# Patient Record
Sex: Female | Born: 1935 | Race: Black or African American | Hispanic: No | Smoking: Never smoker
Health system: Southern US, Community
[De-identification: ages and names within clinical notes are randomized; demographics above are authoritative.]

## PROBLEM LIST (undated history)

## (undated) DIAGNOSIS — IMO0001 Reserved for inherently not codable concepts without codable children: Secondary | ICD-10-CM

## (undated) DIAGNOSIS — I10 Essential (primary) hypertension: Secondary | ICD-10-CM

## (undated) DIAGNOSIS — M1990 Unspecified osteoarthritis, unspecified site: Secondary | ICD-10-CM

## (undated) DIAGNOSIS — K219 Gastro-esophageal reflux disease without esophagitis: Secondary | ICD-10-CM

## (undated) DIAGNOSIS — C8590 Non-Hodgkin lymphoma, unspecified, unspecified site: Secondary | ICD-10-CM

## (undated) DIAGNOSIS — F0390 Unspecified dementia without behavioral disturbance: Secondary | ICD-10-CM

## (undated) DIAGNOSIS — E7800 Pure hypercholesterolemia, unspecified: Secondary | ICD-10-CM

## (undated) DIAGNOSIS — C801 Malignant (primary) neoplasm, unspecified: Secondary | ICD-10-CM

## (undated) HISTORY — PX: CATARACT EXTRACTION: SUR2

## (undated) HISTORY — DX: Essential (primary) hypertension: I10

## (undated) HISTORY — PX: KNEE ARTHROSCOPY: SUR90

## (undated) HISTORY — DX: Pure hypercholesterolemia, unspecified: E78.00

## (undated) HISTORY — DX: Gastro-esophageal reflux disease without esophagitis: K21.9

## (undated) HISTORY — DX: Unspecified osteoarthritis, unspecified site: M19.90

## (undated) HISTORY — DX: Reserved for inherently not codable concepts without codable children: IMO0001

---

## 2005-12-16 ENCOUNTER — Ambulatory Visit: Payer: Self-pay | Admitting: Internal Medicine

## 2006-08-04 ENCOUNTER — Ambulatory Visit: Payer: Self-pay | Admitting: Gerontology

## 2006-08-28 ENCOUNTER — Ambulatory Visit: Payer: Self-pay

## 2006-12-18 ENCOUNTER — Ambulatory Visit: Payer: Self-pay | Admitting: Internal Medicine

## 2006-12-28 ENCOUNTER — Inpatient Hospital Stay: Payer: Self-pay | Admitting: Unknown Physician Specialty

## 2007-01-01 ENCOUNTER — Encounter: Payer: Self-pay | Admitting: Internal Medicine

## 2007-01-09 ENCOUNTER — Encounter: Payer: Self-pay | Admitting: Internal Medicine

## 2007-02-09 ENCOUNTER — Encounter: Payer: Self-pay | Admitting: Internal Medicine

## 2007-03-11 ENCOUNTER — Ambulatory Visit: Payer: Self-pay | Admitting: Unknown Physician Specialty

## 2007-03-11 ENCOUNTER — Encounter: Payer: Self-pay | Admitting: Internal Medicine

## 2007-03-22 ENCOUNTER — Inpatient Hospital Stay: Payer: Self-pay | Admitting: Internal Medicine

## 2007-03-22 ENCOUNTER — Other Ambulatory Visit: Payer: Self-pay

## 2007-04-06 ENCOUNTER — Ambulatory Visit: Payer: Self-pay | Admitting: Unknown Physician Specialty

## 2008-01-07 ENCOUNTER — Ambulatory Visit: Payer: Self-pay | Admitting: Internal Medicine

## 2008-07-27 ENCOUNTER — Ambulatory Visit: Payer: Self-pay | Admitting: Unknown Physician Specialty

## 2008-07-27 ENCOUNTER — Ambulatory Visit: Payer: Self-pay | Admitting: Cardiology

## 2008-07-27 ENCOUNTER — Other Ambulatory Visit: Payer: Self-pay

## 2008-08-03 ENCOUNTER — Inpatient Hospital Stay: Payer: Self-pay | Admitting: Unknown Physician Specialty

## 2008-08-09 ENCOUNTER — Encounter: Payer: Self-pay | Admitting: Internal Medicine

## 2008-08-10 ENCOUNTER — Encounter: Payer: Self-pay | Admitting: Internal Medicine

## 2009-02-01 ENCOUNTER — Ambulatory Visit: Payer: Self-pay | Admitting: Internal Medicine

## 2010-02-20 ENCOUNTER — Ambulatory Visit: Payer: Self-pay | Admitting: Internal Medicine

## 2010-06-11 ENCOUNTER — Ambulatory Visit: Payer: Self-pay | Admitting: Ophthalmology

## 2010-06-24 ENCOUNTER — Ambulatory Visit: Payer: Self-pay | Admitting: Ophthalmology

## 2011-06-04 ENCOUNTER — Ambulatory Visit: Payer: Self-pay | Admitting: Internal Medicine

## 2011-09-03 ENCOUNTER — Ambulatory Visit: Payer: Self-pay

## 2012-08-26 ENCOUNTER — Emergency Department: Payer: Self-pay | Admitting: Unknown Physician Specialty

## 2012-08-26 LAB — APTT: Activated PTT: 25.2 secs (ref 23.6–35.9)

## 2012-08-26 LAB — COMPREHENSIVE METABOLIC PANEL
Albumin: 3.6 g/dL (ref 3.4–5.0)
Anion Gap: 11 (ref 7–16)
BUN: 14 mg/dL (ref 7–18)
Bilirubin,Total: 0.8 mg/dL (ref 0.2–1.0)
Chloride: 91 mmol/L — ABNORMAL LOW (ref 98–107)
Creatinine: 0.59 mg/dL — ABNORMAL LOW (ref 0.60–1.30)
EGFR (African American): >60
Glucose: 149 mg/dL — ABNORMAL HIGH (ref 65–99)
Potassium: 3.9 mmol/L (ref 3.5–5.1)
SGPT (ALT): 31 U/L (ref 12–78)
Sodium: 131 mmol/L — ABNORMAL LOW (ref 136–145)
Total Protein: 8.6 g/dL — ABNORMAL HIGH (ref 6.4–8.2)

## 2012-08-26 LAB — CBC
MCHC: 33.6 g/dL (ref 32.0–36.0)
Platelet: 208 10*3/uL (ref 150–440)
RDW: 13.9 % (ref 11.5–14.5)

## 2012-08-26 LAB — MAGNESIUM: Magnesium: 1.2 mg/dL — ABNORMAL LOW

## 2012-08-28 ENCOUNTER — Observation Stay: Payer: Self-pay | Admitting: Internal Medicine

## 2012-08-28 LAB — COMPREHENSIVE METABOLIC PANEL
Bilirubin,Total: 0.5 mg/dL (ref 0.2–1.0)
Calcium, Total: 8.3 mg/dL — ABNORMAL LOW (ref 8.5–10.1)
Chloride: 97 mmol/L — ABNORMAL LOW (ref 98–107)
Co2: 29 mmol/L (ref 21–32)
Creatinine: 2.57 mg/dL — ABNORMAL HIGH (ref 0.60–1.30)
EGFR (African American): 19 — ABNORMAL LOW
EGFR (Non-African Amer.): 17 — ABNORMAL LOW
SGOT(AST): 23 U/L (ref 15–37)
SGPT (ALT): 23 U/L (ref 12–78)

## 2012-08-28 LAB — TROPONIN I
Troponin-I: 0.03 ng/mL
Troponin-I: 0.03 ng/mL
Troponin-I: <0.02 ng/mL

## 2012-08-28 LAB — URINALYSIS, COMPLETE
Bilirubin,UR: NEGATIVE
Blood: NEGATIVE
Glucose,UR: NEGATIVE mg/dL
Granular Cast: 232
Hyaline Cast: 104
Leukocyte Esterase: NEGATIVE
Nitrite: NEGATIVE
Ph: 5
Protein: >=500
RBC,UR: NONE SEEN /HPF
Specific Gravity: 1.016
Squamous Epithelial: 3
WBC UR: NONE SEEN /HPF

## 2012-08-28 LAB — CK TOTAL AND CKMB (NOT AT ARMC)
CK, Total: 119 U/L (ref 21–215)
CK-MB: 2.5 ng/mL (ref 0.5–3.6)

## 2012-08-28 LAB — CBC
HGB: 11.7 g/dL — ABNORMAL LOW (ref 12.0–16.0)
MCHC: 33.6 g/dL (ref 32.0–36.0)
Platelet: 168 10*3/uL (ref 150–440)
RDW: 14.2 % (ref 11.5–14.5)

## 2012-08-29 LAB — BASIC METABOLIC PANEL
Anion Gap: 6 — ABNORMAL LOW (ref 7–16)
BUN: 28 mg/dL — ABNORMAL HIGH (ref 7–18)
Chloride: 112 mmol/L — ABNORMAL HIGH (ref 98–107)
Co2: 27 mmol/L (ref 21–32)
Creatinine: 0.93 mg/dL (ref 0.60–1.30)
EGFR (Non-African Amer.): 57 — ABNORMAL LOW
Glucose: 98 mg/dL (ref 65–99)
Potassium: 3.9 mmol/L (ref 3.5–5.1)

## 2012-08-29 LAB — CBC WITH DIFFERENTIAL/PLATELET
Basophil #: 0.1 10*3/uL (ref 0.0–0.1)
Basophil %: 0.9 %
Eosinophil #: 0.1 10*3/uL (ref 0.0–0.7)
HCT: 30.6 % — ABNORMAL LOW (ref 35.0–47.0)
Lymphocyte #: 1.8 10*3/uL (ref 1.0–3.6)
Lymphocyte %: 28.2 %
MCH: 29.9 pg (ref 26.0–34.0)
MCHC: 33.3 g/dL (ref 32.0–36.0)
Monocyte #: 1 x10 3/mm — ABNORMAL HIGH (ref 0.2–0.9)
Monocyte %: 16.2 %
Neutrophil #: 3.4 10*3/uL (ref 1.4–6.5)
Neutrophil %: 53.7 %
Platelet: 140 10*3/uL — ABNORMAL LOW (ref 150–440)
RDW: 14.5 % (ref 11.5–14.5)
WBC: 6.3 10*3/uL (ref 3.6–11.0)

## 2012-11-08 ENCOUNTER — Ambulatory Visit: Payer: Self-pay | Admitting: Internal Medicine

## 2015-02-27 NOTE — H&P (Signed)
PATIENT NAME:  Barbara Chavez, Barbara Chavez MR#:  [id number] DATE OF BIRTH:  January 08, 1927  DATE OF ADMISSION:  08/28/2012  PRIMARY CARE PHYSICIAN: Frazier Richards, MD, [facility]    ER PHYSICIAN: Lurline Hare, MD   ADMITTING PHYSICIAN: Dr. Pearletha Furl   PRESENTING COMPLAINT: Loss of consciousness.   HISTORY: The patient is an 81 year old lady who was seen in the Emergency Room yesterday for gastroenteritis and was discharged home. However, on getting home she took her blood pressure medications which included clonidine, labetalol, and some fluid pill which she does not recall the name. Has been having generalized dizziness and weakness and this evening passed out at home when after getting up and for this was brought to the Emergency Room by EMTs. There was no episode of chest pain. No PND, orthopnea, or pedal edema. The patient denies any palpitations. Admits to increasing lightheadedness especially with change in posture. For this she was seen in the Emergency Room. Work-up here included a CBC which showed low blood count from 13.6 yesterday, now down to 11.7. The patient denies any episode of bleeding from the vomitus. Also, chemistry shows creatinine has gone up to 2.5 from baseline of 0.5 from yesterday with BUN of 41 from 14. For this she was referred to hospitalist for further evaluation. The patient denies any recent long distance travel or sick contact. No recent change in medication. Admits to being compliant with her medication.   REVIEW OF SYSTEMS: CONSTITUTIONAL: Positive for fatigue, weakness. No weight loss or weight gain. EYES: Positive for lightheadedness and blurred vision on change in posture but no redness or discharge. ENT: No tinnitus, epistaxis, or difficulty swallowing. RESPIRATORY: Denies cough or shortness of breath. CARDIOVASCULAR: No chest pain, palpitations, arrhythmia, or exertional dyspnea but had syncopal episode today. GI: Positive for nausea and vomiting in the evening. Has not had  any diarrhea. No abdominal pain. GU: No dysuria, frequency, or incontinence. HEMATOLOGIC: No anemia, easy bruising or bleeding. ENDOCRINE: No polyuria or polydipsia. SKIN: No rash, change in hair or skin texture. MUSCULOSKELETAL: No joint pain, redness, or limited activity. NEUROLOGIC: No numbness, tremors, or memory loss. PSYCH: No anxiety or depression.   PAST MEDICAL HISTORY:  1. Hyperlipidemia. 2. Migraine.  3. Anxiety.  4. Osteoarthritis.  5. Hypertension.  6. Asthma.  7. Glaucoma.   PAST SURGICAL HISTORY: Bilateral total knee replacement.   SOCIAL HISTORY: Lives at home with her family. No alcohol, tobacco, or recreational drug use.   FAMILY HISTORY: Reviewed and noncontributory.   ALLERGIES: No known drug allergies.   MEDICATIONS:  1. Tramadol 50 mg 2 tablets q.8 hours. 2. Clonidine 0.2 mg twice daily.  3. Citalopram 20 mg daily.  4. Atorvastatin 40 mg daily.  5. Buspirone 15 mg twice a day. 6. Labetalol 300 mg twice daily.  7. The patient also states she takes a fluid, which she cannot recall the name.   PHYSICAL EXAMINATION:   VITAL SIGNS: Temperature 98.2, pulse 73, respiratory rate 18, blood pressure 103/51, oxygen sat 97 on room air. The patient is too weak to do any orthostatics with changing postures.   GENERAL: Elderly lady lying on the gurney awake, alert, oriented to time, place, and person, appears very weak.   HEENT: Atraumatic, normocephalic. Pupils equal, reactive to light and accommodation. Extraocular movement intact. Mucous membranes pink and dry.   NECK: Supple. No JV distention.   CHEST: Good air entry. Clear to auscultation.   HEART: Regular rate and rhythm. No murmurs.   ABDOMEN: Full, moves  with respiration, nontender. Bowel sounds hypoactive. No organomegaly.   EXTREMITIES: No edema, clubbing, deformity.   NEUROLOGICAL: No focal deficits.   PSYCH: Affect appropriate to situation.   LABORATORY, DIAGNOSTIC, AND RADIOLOGICAL DATA: EKG  showed normal sinus rhythm, rate of 73.   CT head showed no acute intracranial abnormality.   CBC white count 9, hemoglobin 11.7 down from 13.6 from yesterday, platelets 168. No differential. Chemistries sodium 138, potassium 3, down from 3.9, creatinine 2.5 up from 0.5 from yesterday. BUN is 41 from 14, glucose 108, calcium 8.3. Normal LFTs. Troponin negative. Urinalysis is negative.   IMPRESSION:  1. Syncopal episode most likely from orthostatic hypotension from severe dehydration.  2. Severe dehydration secondary to #3. 3. Gastroenteritis, now resolving.  4. Acute kidney injury secondary to #2 & #3 above.  5. Hypokalemia from the vomiting.  6. History of hypertension, currently normotensive, likely exacerbated by dehydration and medication use.  7. Anemia, not otherwise specified, consider dilutional from IV fluid rehydration.  8. Hyperlipidemia, stable.  9. Asthma, stable.  10. Anxiety and migraines, stable.   PLAN:  1. Admit to general medical floor under telemetry and observation. 2. Orthostatic vitals when patient is more stable. 3. Serial cardiac enzymes. 4. Telemetry.  5. Consider echocardiogram in a.m.  6. IV fluids for rehydration. 7. Monitor I's and O's.  8. Replete potassium. 9. Hold her antihypertensives tonight until blood pressure stabilizes.  10. Check hemoglobin and hematocrit q.12.  11. GI prophylaxis with Protonix.   CODE STATUS: FULL CODE.   TOTAL PATIENT CARE TIME: 50 minutes.   Will transfer to Dr. Tonette Bihari service in the morning.   ____________________________ Jules Husbands. Pearletha Furl, MD mia:drc D: 08/28/2012 02:36:02 ET T: 08/28/2012 08:46:30 ET JOB#: [id number]  cc: Nani Ingram I. Pearletha Furl, MD, <Dictator> Ocie Cornfield. Ouida Sills, MD Carola Frost MD ELECTRONICALLY SIGNED 08/29/2012 15:04

## 2015-03-16 ENCOUNTER — Other Ambulatory Visit: Payer: Self-pay | Admitting: Specialist

## 2015-03-16 DIAGNOSIS — M25511 Pain in right shoulder: Secondary | ICD-10-CM

## 2015-03-17 ENCOUNTER — Ambulatory Visit
Admission: RE | Admit: 2015-03-17 | Discharge: 2015-03-17 | Disposition: A | Discharge status: Home / Self Care | Payer: Medicare Other | Source: Ambulatory Visit | Attending: Specialist | Admitting: Specialist

## 2015-03-17 DIAGNOSIS — M75111 Incomplete rotator cuff tear or rupture of right shoulder, not specified as traumatic: Secondary | ICD-10-CM | POA: Insufficient documentation

## 2015-03-17 DIAGNOSIS — M12811 Other specific arthropathies, not elsewhere classified, right shoulder: Secondary | ICD-10-CM | POA: Insufficient documentation

## 2015-03-17 DIAGNOSIS — S4381XD Sprain of other specified parts of right shoulder girdle, subsequent encounter: Secondary | ICD-10-CM | POA: Insufficient documentation

## 2015-03-17 DIAGNOSIS — M25511 Pain in right shoulder: Secondary | ICD-10-CM

## 2015-03-17 DIAGNOSIS — X58XXXD Exposure to other specified factors, subsequent encounter: Secondary | ICD-10-CM | POA: Insufficient documentation

## 2015-04-20 ENCOUNTER — Encounter (INDEPENDENT_AMBULATORY_CARE_PROVIDER_SITE_OTHER): Payer: Self-pay

## 2015-04-20 ENCOUNTER — Encounter: Payer: Self-pay | Admitting: Hematology and Oncology

## 2015-04-20 ENCOUNTER — Ambulatory Visit: Payer: Medicare Other

## 2015-04-20 ENCOUNTER — Telehealth: Payer: Self-pay | Admitting: Hematology and Oncology

## 2015-04-20 ENCOUNTER — Inpatient Hospital Stay: Payer: Medicare Other | Attending: Hematology and Oncology | Admitting: Hematology and Oncology

## 2015-04-20 VITALS — BP 166/67 | HR 60 | Temp 95.8°F | Ht 61.0 in | Wt 213.4 lb

## 2015-04-20 DIAGNOSIS — M7591 Shoulder lesion, unspecified, right shoulder: Secondary | ICD-10-CM | POA: Diagnosis not present

## 2015-04-20 DIAGNOSIS — C4001 Malignant neoplasm of scapula and long bones of right upper limb: Secondary | ICD-10-CM

## 2015-04-20 DIAGNOSIS — M25562 Pain in left knee: Secondary | ICD-10-CM | POA: Insufficient documentation

## 2015-04-20 DIAGNOSIS — D509 Iron deficiency anemia, unspecified: Secondary | ICD-10-CM | POA: Insufficient documentation

## 2015-04-20 DIAGNOSIS — Z809 Family history of malignant neoplasm, unspecified: Secondary | ICD-10-CM

## 2015-04-20 DIAGNOSIS — I10 Essential (primary) hypertension: Secondary | ICD-10-CM | POA: Diagnosis not present

## 2015-04-20 DIAGNOSIS — E780 Pure hypercholesterolemia: Secondary | ICD-10-CM | POA: Insufficient documentation

## 2015-04-20 DIAGNOSIS — D649 Anemia, unspecified: Secondary | ICD-10-CM | POA: Diagnosis not present

## 2015-04-20 DIAGNOSIS — Z7982 Long term (current) use of aspirin: Secondary | ICD-10-CM | POA: Insufficient documentation

## 2015-04-20 DIAGNOSIS — K219 Gastro-esophageal reflux disease without esophagitis: Secondary | ICD-10-CM | POA: Diagnosis not present

## 2015-04-20 DIAGNOSIS — M549 Dorsalgia, unspecified: Secondary | ICD-10-CM | POA: Insufficient documentation

## 2015-04-20 DIAGNOSIS — Z79899 Other long term (current) drug therapy: Secondary | ICD-10-CM | POA: Diagnosis not present

## 2015-04-20 DIAGNOSIS — M899 Disorder of bone, unspecified: Secondary | ICD-10-CM

## 2015-04-20 DIAGNOSIS — C4000 Malignant neoplasm of scapula and long bones of unspecified upper limb: Secondary | ICD-10-CM | POA: Insufficient documentation

## 2015-04-20 DIAGNOSIS — R5383 Other fatigue: Secondary | ICD-10-CM | POA: Diagnosis not present

## 2015-04-20 DIAGNOSIS — M1990 Unspecified osteoarthritis, unspecified site: Secondary | ICD-10-CM | POA: Diagnosis not present

## 2015-04-20 DIAGNOSIS — M25561 Pain in right knee: Secondary | ICD-10-CM | POA: Insufficient documentation

## 2015-04-20 DIAGNOSIS — G8929 Other chronic pain: Secondary | ICD-10-CM | POA: Insufficient documentation

## 2015-04-20 DIAGNOSIS — M25511 Pain in right shoulder: Secondary | ICD-10-CM | POA: Insufficient documentation

## 2015-04-20 LAB — CREATININE, SERUM
Creatinine, Ser: 1.32 mg/dL — ABNORMAL HIGH (ref 0.44–1.00)
GFR calc Af Amer: 41 mL/min — ABNORMAL LOW (ref >60)
GFR calc non Af Amer: 35 mL/min — ABNORMAL LOW (ref >60)

## 2015-04-20 LAB — RETICULOCYTES
RBC.: 3.55 MIL/uL — ABNORMAL LOW (ref 3.80–5.20)
Retic Count, Absolute: 42.6 10*3/uL (ref 19.0–183.0)
Retic Ct Pct: 1.2 % (ref 0.4–3.1)

## 2015-04-20 LAB — IRON AND TIBC
Iron: 32 ug/dL (ref 28–170)
Saturation Ratios: 10 % — ABNORMAL LOW (ref 10.4–31.8)
TIBC: 335 ug/dL (ref 250–450)
UIBC: 303 ug/dL

## 2015-04-20 LAB — VITAMIN B12: Vitamin B-12: 283 pg/mL (ref 180–914)

## 2015-04-20 LAB — FERRITIN: Ferritin: 40 ng/mL (ref 11–307)

## 2015-04-20 LAB — FOLATE: Folate: 24 ng/mL (ref >5.9)

## 2015-04-20 NOTE — Progress Notes (Signed)
Pt here today for initial consult regarding mass in right shoulder

## 2015-04-20 NOTE — Progress Notes (Signed)
[facility] day:  04/20/2015  Chief Complaint: Barbara Chavez is an 81 y.o. female  with a right shoulder mass who is referred for consultation by Dr. Frazier Richards for assessment and management.  HPI: The patient notes that about 3 months ago her right shoulder began hurting. She was seen by Dr. Sabra Heck, orthopedic surgeon. She received a cortisone injection. As her symptoms did not improve, she returned for assessment. X-ray revealed a fracture. It was recommended that she have an MRI of the shoulder. MRI of the right shoulder on 03/16/2005 revealed an expansile destructive process of the acromion with possible involvement of the distal clavicle and AC joint. There was geographic abnormal involvement of the humeral head medially. Differential included myeloma, metastatic disease, osteomyelitis, and gout joint arthropathy.  The patient was referred to Dr. Juliette Alcide, orthopedic surgeon in [place]. No note available.  She was referred back to Dr. Ouida Sills for additional workup. Labs on 04/11/2015 revealed a mild anemia with a hematocrit of 30.5 and hemoglobin 9.8. Comprehensive metabolic panel was normal. Protein, albumen, calcium, and liver function tests were normal.  The patient states that she feels fine. Her weight is stable. She denies any fevers or sweats. She notes a chronic pain her back as well as knees. Back pain has improved since 07/01/2014 as she is no longer lifting her husband. Her last colonoscopy was greater than 10 years ago. Last mammogram was in early 2015. She has not pelvic exam in a "long time".  She states that her diet is good. She eats meat several times during the week. She likes roast chicken. She denies pica. She denies any melena, hematochezia, hematuria or vaginal bleeding.  Past Medical History  Diagnosis Date  . Hypertension   . Reflux   . Osteoarthritis   . Hypercholesterolemia     Past Surgical History   Procedure Laterality Date  . Cataract extraction      Family History  Problem Relation Age of Onset  . Cancer Sister     Social History:  reports that she has never smoked. She has never used smokeless tobacco. She reports that she does not drink alcohol or use illicit drugs.  The patient is accompanied by her daughter Barbara Chavez.  The patient lives by herself.  Her daughter is nearby.  She is a retired Oncologist.  She denies any exposure to radiation or toxins.  Allergies: No Known Allergies  Current Medications: Current Outpatient Prescriptions  Medication Sig Dispense Refill  . atorvastatin (LIPITOR) 40 MG tablet Take by mouth.    . busPIRone (BUSPAR) 15 MG tablet Take by mouth.    . cetirizine (ZYRTEC) 10 MG tablet Take by mouth.    . citalopram (CELEXA) 20 MG tablet TAKE ONE TABLET BY MOUTH EVERY DAY    . cloNIDine (CATAPRES) 0.2 MG tablet TAKE ONE TABLET BY MOUTH TWICE A DAY FOR BLOOD PRESSURE    . labetalol (NORMODYNE) 300 MG tablet TAKE TWO TABLETS BY MOUTH TWICE DAILY    . torsemide (DEMADEX) 20 MG tablet Take by mouth.    . traMADol (ULTRAM) 50 MG tablet TAKE ONE OR TWO TABLETS BY MOUTH EVERY EIGHT HOURS AS NEEDED FOR PAIN    . aspirin 81 MG chewable tablet Chew by mouth.    . calcium carbonate (CALCIUM 600) 600 MG TABS tablet Take by mouth.    . Cholecalciferol (VITAMIN D3) 2000 UNITS capsule Take by mouth.    Marland Kitchen  donepezil (ARICEPT) 10 MG tablet Take by mouth.    . pantoprazole (PROTONIX) 40 MG tablet Take by mouth.     No current facility-administered medications for this visit.   Review of Systems:  GENERAL:  Feels good  Active.  No fevers, sweats or weight loss. PERFORMANCE STATUS (ECOG):  2 HEENT:  Recent new glasses.  No diplopia.  No runny nose, sore throat, mouth sores or tenderness. Lungs: No shortness of breath or cough.  No hemoptysis. Cardiac:  No chest pain, palpitations, orthopnea, or PND. GI:  No nausea, vomiting, diarrhea, constipation,  melena or hematochezia. GU:  No urgency, frequency, dysuria, or hematuria. Musculoskeletal:  Right shoulder "sore".  Chronic back discomfort (improving).  Knee pain.  No muscle tenderness. Extremities:  Chronic lower extremity swelling. Skin:  No rashes or skin changes. Neuro:  No headache, numbness or weakness, balance or coordination issues. Endocrine:  No diabetes, thyroid issues, hot flashes or night sweats. Psych:  No mood changes, depression or anxiety. Pain:  No focal pain. Review of systems:  All other systems reviewed and found to be negative.  Physical Exam: Blood pressure 166/67, pulse 60, temperature 95.8 F (35.4 C), temperature source Tympanic, height 5' 1"  (1.549 m), weight 213 lb 6.5 oz (96.8 kg). GENERAL:  Well developed, well nourished, sitting comfortably in a wheelchair in the exam room in no acute distress. MENTAL STATUS:  Alert and oriented to person, place and time. HEAD:  Owens Shark styled hair.  Normocephalic, atraumatic, face symmetric, no Cushingoid features. EYES: Brown eyes.  Pupils equal round and reactive to light and accomodation.  No conjunctivitis or scleral icterus. ENT:  Hard of hearing.  Oropharynx clear without lesion.  Tongue normal. Mucous membranes moist.  RESPIRATORY:  Clear to auscultation without rales, wheezes or rhonchi. CARDIOVASCULAR:  Regular rate and rhythm without murmur, rub or gallop. BREAST:  Right breast without masses, skin changes or nipple discharge.  Left breast without masses, skin changes or nipple discharge.  Bilateral fibrocystic changes. ABDOMEN:  Limited as patient examined in chair.  Soft, non-tender, with active bowel sounds, and no appreciable hepatosplenomegaly.  No masses. SKIN:  Hyperpigmentation in the intertriginous area between 1st and 2nd digit. No rashes, ulcers or lesions. EXTREMITIES:  Chronic lower extremity edema.  No palpable cords. LYMPH NODES: No palpable cervical, supraclavicular, axillary or inguinal adenopathy   NEUROLOGICAL: Unremarkable. PSYCH:  Appropriate.  Appointment on 04/20/2015  Component Date Value Ref Range Status  . Retic Ct Pct 04/20/2015 1.2  0.4 - 3.1 % Final  . RBC. 04/20/2015 3.55* 3.80 - 5.20 MIL/uL Final  . Retic Count, Manual 04/20/2015 42.6  19.0 - 183.0 K/uL Final  . Creatinine, Ser 04/20/2015 1.32* 0.44 - 1.00 mg/dL Final  . GFR calc non Af Amer 04/20/2015 35* >60 mL/min Final  . GFR calc Af Amer 04/20/2015 41* >60 mL/min Final   Comment: (NOTE) The eGFR has been calculated using the CKD EPI equation. This calculation has not been validated in all clinical situations. eGFR's persistently <60 mL/min signify possible Chronic Kidney Disease.     Assessment:  Kelvin Burpee is an 81 y.o. female with a destructive lesion of the acromion with possible involvement of the distal clavicle and AC joint, and a geographic abnormal humeral head on shoulder MRI from MRI/05/2015. Labs from 04/11/2015 revealed a normocytic anemia.  Symptomatically, she feels "fine".  She has chronic back pain which is improving. She notes some discomfort in her right shoulder. She has had no trauma.  Last mammogram in early 2015 was negative. She has not had a colonoscopy in greater than 10 years. It has been sometime since her last pelvic exam. Exam is unremarkable.   Plan: 1. Review labs and imaging study. Discussed concern for malignancy given expansile destructive process shoulder. Differential diagnoses was discussed including multiple myeloma as well as metastatic disease. Urine and blood testing as well as radiographic studies (CT scan and bone scan) were discussed. A mammogram will be ordered as she is past due.  I discussed obtaining a biopsy to confirm diagnosis once imaging studies have been completed. 2. Labs today: ferritin, iron studies, B12, folate, SPEP, free light chain assay, and CEA. 3. Collect 24 hour urine for urine protein electrophoresis and free light chains 4. Bilateral  screening mammogram. 5. Bone scan: assess for metastatic disease 6. Chest, abdomen, and pelvic CT scan with contrast: assess for primary lesion. 7. Return to clinic after above to discuss results and direction of therapy  Addendum: The patient's creatinine was elevated on today's labs.  A PET scan will be ordered instead of CT scans with contrast.   Lequita Asal, MD  04/20/2015, 1:44 PM

## 2015-04-22 DIAGNOSIS — M7591 Shoulder lesion, unspecified, right shoulder: Secondary | ICD-10-CM | POA: Diagnosis not present

## 2015-04-22 LAB — KAPPA/LAMBDA LIGHT CHAINS
Kappa free light chain: 36.12 mg/L — ABNORMAL HIGH (ref 3.30–19.40)
Kappa, lambda light chain ratio: 1.52 (ref 0.26–1.65)
Lambda free light chains: 23.7 mg/L (ref 5.71–26.30)

## 2015-04-22 LAB — CEA: CEA: 1.4 ng/mL (ref 0.0–4.7)

## 2015-04-23 LAB — PROTEIN ELECTROPHORESIS, SERUM
A/G Ratio: 1 (ref 0.7–2.0)
Albumin ELP: 3.3 g/dL (ref 2.9–4.4)
Alpha-1-Globulin: 0.3 g/dL (ref 0.1–0.4)
Alpha-2-Globulin: 0.8 g/dL (ref 0.4–1.2)
Beta Globulin: 1.2 g/dL (ref 0.6–1.3)
Gamma Globulin: 1.1 g/dL (ref 0.5–1.6)
Globulin, Total: 3.3 g/dL (ref 2.0–4.5)
Total Protein ELP: 6.6 g/dL (ref 6.0–8.5)

## 2015-04-24 ENCOUNTER — Ambulatory Visit
Admission: RE | Admit: 2015-04-24 | Discharge: 2015-04-24 | Disposition: A | Discharge status: Home / Self Care | Payer: Medicare Other | Source: Ambulatory Visit | Attending: Hematology and Oncology | Admitting: Hematology and Oncology

## 2015-04-24 DIAGNOSIS — M899 Disorder of bone, unspecified: Secondary | ICD-10-CM

## 2015-04-24 DIAGNOSIS — Z1231 Encounter for screening mammogram for malignant neoplasm of breast: Secondary | ICD-10-CM | POA: Insufficient documentation

## 2015-04-24 DIAGNOSIS — C4001 Malignant neoplasm of scapula and long bones of right upper limb: Secondary | ICD-10-CM

## 2015-04-24 LAB — UIFE/LIGHT CHAINS/TP QN, 24-HR UR
% BETA, Urine: 29.7 %
ALPHA 1 URINE: 3.8 %
Albumin, U: 45.9 %
Alpha 2, Urine: 13.4 %
Free Kappa/Lambda Ratio: 7.08 (ref 2.04–10.37)
Free Lambda Lt Chains,Ur: 3.42 mg/L (ref 0.24–6.66)
Free Lt Chn Excr Rate: 24.2 mg/L — ABNORMAL HIGH (ref 1.35–24.19)
GAMMA GLOBULIN URINE: 7.1 %
Time: 3368 hours
Total Protein, Urine-Ur/day: 88.6 mg/24 hr (ref 30.0–150.0)
Total Protein, Urine: 7.7 mg/dL
Volume, Urine: 1150 mL

## 2015-04-24 LAB — MISC LABCORP TEST (SEND OUT): Labcorp test code: 121228

## 2015-04-26 ENCOUNTER — Ambulatory Visit
Admission: RE | Admit: 2015-04-26 | Discharge: 2015-04-26 | Disposition: A | Discharge status: Home / Self Care | Payer: Medicare Other | Source: Ambulatory Visit | Attending: Hematology and Oncology | Admitting: Hematology and Oncology

## 2015-04-26 DIAGNOSIS — M899 Disorder of bone, unspecified: Secondary | ICD-10-CM | POA: Diagnosis not present

## 2015-04-26 DIAGNOSIS — C4001 Malignant neoplasm of scapula and long bones of right upper limb: Secondary | ICD-10-CM | POA: Diagnosis not present

## 2015-04-26 DIAGNOSIS — Z96651 Presence of right artificial knee joint: Secondary | ICD-10-CM | POA: Diagnosis not present

## 2015-04-26 DIAGNOSIS — Z79899 Other long term (current) drug therapy: Secondary | ICD-10-CM | POA: Diagnosis not present

## 2015-04-26 HISTORY — DX: Malignant (primary) neoplasm, unspecified: C80.1

## 2015-04-26 LAB — GLUCOSE, CAPILLARY: Glucose-Capillary: 111 mg/dL — ABNORMAL HIGH (ref 65–99)

## 2015-04-26 MED ORDER — FLUDEOXYGLUCOSE F - 18 (FDG) INJECTION
12.8100 | Freq: Once | INTRAVENOUS | Status: AC | PRN
  Administered 2015-04-26: 12.81 via INTRAVENOUS

## 2015-04-27 ENCOUNTER — Encounter
Admission: RE | Admit: 2015-04-27 | Discharge: 2015-04-27 | Disposition: A | Discharge status: Home / Self Care | Payer: Medicare Other | Source: Ambulatory Visit | Attending: Hematology and Oncology | Admitting: Hematology and Oncology

## 2015-04-27 ENCOUNTER — Ambulatory Visit
Admission: RE | Admit: 2015-04-27 | Discharge: 2015-04-27 | Disposition: A | Discharge status: Home / Self Care | Payer: Medicare Other | Source: Ambulatory Visit | Attending: Hematology and Oncology | Admitting: Hematology and Oncology

## 2015-04-27 ENCOUNTER — Other Ambulatory Visit: Payer: Medicare Other

## 2015-04-27 DIAGNOSIS — C4001 Malignant neoplasm of scapula and long bones of right upper limb: Secondary | ICD-10-CM | POA: Diagnosis not present

## 2015-04-27 DIAGNOSIS — M899 Disorder of bone, unspecified: Secondary | ICD-10-CM

## 2015-04-27 MED ORDER — TECHNETIUM TC 99M MEDRONATE IV KIT
25.0000 | PACK | Freq: Once | INTRAVENOUS | Status: AC | PRN
  Administered 2015-04-27: 23.61 via INTRAVENOUS

## 2015-04-30 ENCOUNTER — Telehealth: Payer: Self-pay | Admitting: *Deleted

## 2015-04-30 ENCOUNTER — Ambulatory Visit: Payer: Medicare Other | Admitting: Hematology and Oncology

## 2015-04-30 NOTE — Telephone Encounter (Signed)
Called and left message that Dr Mike Gip will be in office tomorrow and will address this tomorrow

## 2015-05-01 NOTE — Telephone Encounter (Signed)
Called Marissa and informed her that per Dr Mike Gip her mother will need a tissue biopsy and she will discuss this further at her appt Thursday. She thanked me for calling and said they will be here Thursday

## 2015-05-03 ENCOUNTER — Inpatient Hospital Stay: Payer: Medicare Other

## 2015-05-03 ENCOUNTER — Inpatient Hospital Stay (HOSPITAL_BASED_OUTPATIENT_CLINIC_OR_DEPARTMENT_OTHER): Payer: Medicare Other | Admitting: Hematology and Oncology

## 2015-05-03 VITALS — BP 188/72 | HR 50 | Temp 95.2°F | Wt 211.5 lb

## 2015-05-03 DIAGNOSIS — Z7982 Long term (current) use of aspirin: Secondary | ICD-10-CM

## 2015-05-03 DIAGNOSIS — R591 Generalized enlarged lymph nodes: Secondary | ICD-10-CM

## 2015-05-03 DIAGNOSIS — E780 Pure hypercholesterolemia: Secondary | ICD-10-CM

## 2015-05-03 DIAGNOSIS — R599 Enlarged lymph nodes, unspecified: Secondary | ICD-10-CM

## 2015-05-03 DIAGNOSIS — Z809 Family history of malignant neoplasm, unspecified: Secondary | ICD-10-CM

## 2015-05-03 DIAGNOSIS — Z79899 Other long term (current) drug therapy: Secondary | ICD-10-CM | POA: Diagnosis not present

## 2015-05-03 DIAGNOSIS — M549 Dorsalgia, unspecified: Secondary | ICD-10-CM

## 2015-05-03 DIAGNOSIS — M7591 Shoulder lesion, unspecified, right shoulder: Secondary | ICD-10-CM | POA: Diagnosis not present

## 2015-05-03 DIAGNOSIS — M25511 Pain in right shoulder: Secondary | ICD-10-CM

## 2015-05-03 DIAGNOSIS — M1990 Unspecified osteoarthritis, unspecified site: Secondary | ICD-10-CM

## 2015-05-03 DIAGNOSIS — M25561 Pain in right knee: Secondary | ICD-10-CM

## 2015-05-03 DIAGNOSIS — G8929 Other chronic pain: Secondary | ICD-10-CM

## 2015-05-03 DIAGNOSIS — C8290 Follicular lymphoma, unspecified, unspecified site: Secondary | ICD-10-CM

## 2015-05-03 DIAGNOSIS — R5383 Other fatigue: Secondary | ICD-10-CM

## 2015-05-03 DIAGNOSIS — M25562 Pain in left knee: Secondary | ICD-10-CM

## 2015-05-03 DIAGNOSIS — D649 Anemia, unspecified: Secondary | ICD-10-CM

## 2015-05-03 DIAGNOSIS — C4001 Malignant neoplasm of scapula and long bones of right upper limb: Secondary | ICD-10-CM

## 2015-05-03 DIAGNOSIS — K219 Gastro-esophageal reflux disease without esophagitis: Secondary | ICD-10-CM

## 2015-05-03 DIAGNOSIS — I10 Essential (primary) hypertension: Secondary | ICD-10-CM

## 2015-05-03 LAB — CBC WITH DIFFERENTIAL/PLATELET
Basophils Absolute: 0.1 10*3/uL (ref 0–0.1)
Basophils Relative: 1 %
Eosinophils Absolute: 0.1 10*3/uL (ref 0–0.7)
Eosinophils Relative: 1 %
HCT: 31.7 % — ABNORMAL LOW (ref 35.0–47.0)
Hemoglobin: 10.2 g/dL — ABNORMAL LOW (ref 12.0–16.0)
Lymphocytes Relative: 19 %
Lymphs Abs: 1.6 10*3/uL (ref 1.0–3.6)
MCH: 28.2 pg (ref 26.0–34.0)
MCHC: 32.2 g/dL (ref 32.0–36.0)
MCV: 87.6 fL (ref 80.0–100.0)
Monocytes Absolute: 0.9 10*3/uL (ref 0.2–0.9)
Monocytes Relative: 10 %
Neutro Abs: 5.8 10*3/uL (ref 1.4–6.5)
Neutrophils Relative %: 69 %
Platelets: 254 10*3/uL (ref 150–440)
RBC: 3.62 MIL/uL — ABNORMAL LOW (ref 3.80–5.20)
RDW: 15.7 % — ABNORMAL HIGH (ref 11.5–14.5)
WBC: 8.4 10*3/uL (ref 3.6–11.0)

## 2015-05-03 LAB — PROTIME-INR
INR: 1.06
Prothrombin Time: 14 seconds (ref 11.4–15.0)

## 2015-05-03 LAB — LACTATE DEHYDROGENASE: LDH: 217 U/L — ABNORMAL HIGH (ref 98–192)

## 2015-05-03 LAB — URIC ACID: Uric Acid, Serum: 7.6 mg/dL — ABNORMAL HIGH (ref 2.3–6.6)

## 2015-05-04 ENCOUNTER — Other Ambulatory Visit: Payer: Self-pay | Admitting: Hematology and Oncology

## 2015-05-04 ENCOUNTER — Ambulatory Visit: Payer: Medicare Other | Admitting: Hematology and Oncology

## 2015-05-07 ENCOUNTER — Other Ambulatory Visit: Payer: Self-pay | Admitting: Hematology and Oncology

## 2015-05-07 DIAGNOSIS — R599 Enlarged lymph nodes, unspecified: Secondary | ICD-10-CM

## 2015-05-07 DIAGNOSIS — R591 Generalized enlarged lymph nodes: Secondary | ICD-10-CM

## 2015-05-08 ENCOUNTER — Other Ambulatory Visit: Payer: Self-pay | Admitting: Hematology and Oncology

## 2015-05-08 ENCOUNTER — Ambulatory Visit
Admission: RE | Admit: 2015-05-08 | Discharge: 2015-05-08 | Disposition: A | Discharge status: Home / Self Care | Payer: Medicare Other | Source: Ambulatory Visit | Attending: Hematology and Oncology | Admitting: Hematology and Oncology

## 2015-05-08 DIAGNOSIS — C8290 Follicular lymphoma, unspecified, unspecified site: Secondary | ICD-10-CM | POA: Insufficient documentation

## 2015-05-08 DIAGNOSIS — R591 Generalized enlarged lymph nodes: Secondary | ICD-10-CM | POA: Diagnosis present

## 2015-05-08 DIAGNOSIS — R599 Enlarged lymph nodes, unspecified: Secondary | ICD-10-CM

## 2015-05-08 NOTE — OR Nursing (Signed)
No iv needed per dr Annamaria Boots

## 2015-05-08 NOTE — Procedures (Signed)
Successful RT INGUINAL ADENPATHY 18 G CORE BX (6) NO COMP STABLE PATH PENDING FULL REPORT IN PACS

## 2015-05-18 ENCOUNTER — Telehealth: Payer: Self-pay | Admitting: *Deleted

## 2015-05-18 NOTE — Telephone Encounter (Signed)
Yes I called and spoke with [facility]

## 2015-05-18 NOTE — Telephone Encounter (Signed)
  Does patient know we are waiting for results?  M

## 2015-05-18 NOTE — Telephone Encounter (Signed)
I called Histology dept who said that Dr Luana Shu sent bx to [facility] in [place] on 7/1, results are not back at this time , but lab will notify us as soon as it is back Marissa informed of this.

## 2015-05-21 ENCOUNTER — Telehealth: Payer: Self-pay

## 2015-05-21 LAB — SURGICAL PATHOLOGY

## 2015-05-21 NOTE — Telephone Encounter (Signed)
Do you still want to see the pt tomorrow without results back yet?

## 2015-05-21 NOTE — Telephone Encounter (Signed)
  Do we have a final pathology yet?  Can you call? This seems to be taking a long time.  I should see the patient soon.  Can you put her on the schedule for tomorrow?  M

## 2015-05-21 NOTE — Telephone Encounter (Signed)
  Ask patient if she wants to wait until final path back.  M

## 2015-05-21 NOTE — Telephone Encounter (Signed)
  We are waiting final path.  It is taking forever.  I asked Juliann Pulse to call patient and see if she wants to come in before final back. We likely need to do a few follow-up studies.  M

## 2015-05-21 NOTE — Telephone Encounter (Signed)
Called pt to speak about setting up an appointment for final path results.  Pt stated "im not coming back" right away and Dr. Mike Gip already gave me my results.  I asked pt what Dr. Mike Gip said and she stated "she said, there ain't no cancer cells there".  Pt proceeds to state that she got a cold from coming in last time from the cold air here and a virus that has been going around after coming in when they "ran that thing down my side".  I then asked if she had gotten her results from when she came in for that and she stated "no".  I asked when was that appointment and she stated "it was Wednesday maybe 2 weeks ago".  I then again asked her if she wanted to come in for an appointment when the final path results were in she stated "I don't know, my daughter has to take off and rearrange everything".  I asked pt if we could just call her back and see if she would like to set up an appointment when the results were in and she said that would be "ok" but wasn't sure if she would come.

## 2015-05-21 NOTE — Telephone Encounter (Signed)
According to [facility], she had a PET scan on 6/16

## 2015-05-21 NOTE — Telephone Encounter (Signed)
  Please call the patient and apologize.  Has she had a PET scan?  M

## 2015-05-21 NOTE — Telephone Encounter (Signed)
Spoke with Aaron Edelman in pathology and he stated results should be back this week; was delayed due to the July 4th holiday

## 2015-05-23 ENCOUNTER — Telehealth: Payer: Self-pay

## 2015-05-23 NOTE — Telephone Encounter (Signed)
Spoke with pt's daughter Barbara Chavez at 1430 on 05/23/2015.  Pt's daughter was calling about final path results.  I informed daughter that I had spoken with her mother about setting up a follow up for path results and her mother verbalized she was not sure she wanted to come in due to daughters schedule and catching a cold last time she was here.  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  I obtained her phone number and and preferred time of mid afternoon and let scheduling have her number to call back in and schedule her a follow up.

## 2015-05-23 NOTE — Telephone Encounter (Deleted)
Spoke with pt's daughter

## 2015-05-24 DIAGNOSIS — C8290 Follicular lymphoma, unspecified, unspecified site: Secondary | ICD-10-CM | POA: Insufficient documentation

## 2015-05-25 ENCOUNTER — Inpatient Hospital Stay: Payer: Medicare Other | Attending: Hematology and Oncology

## 2015-05-25 DIAGNOSIS — C8200 Follicular lymphoma grade I, unspecified site: Secondary | ICD-10-CM | POA: Insufficient documentation

## 2015-05-25 DIAGNOSIS — Z5112 Encounter for antineoplastic immunotherapy: Secondary | ICD-10-CM | POA: Insufficient documentation

## 2015-05-25 DIAGNOSIS — M899 Disorder of bone, unspecified: Secondary | ICD-10-CM | POA: Insufficient documentation

## 2015-05-25 DIAGNOSIS — Z803 Family history of malignant neoplasm of breast: Secondary | ICD-10-CM | POA: Diagnosis not present

## 2015-05-25 DIAGNOSIS — Z7982 Long term (current) use of aspirin: Secondary | ICD-10-CM | POA: Insufficient documentation

## 2015-05-25 DIAGNOSIS — D649 Anemia, unspecified: Secondary | ICD-10-CM | POA: Insufficient documentation

## 2015-05-25 DIAGNOSIS — M1990 Unspecified osteoarthritis, unspecified site: Secondary | ICD-10-CM | POA: Diagnosis not present

## 2015-05-25 DIAGNOSIS — K219 Gastro-esophageal reflux disease without esophagitis: Secondary | ICD-10-CM | POA: Insufficient documentation

## 2015-05-25 DIAGNOSIS — M25512 Pain in left shoulder: Secondary | ICD-10-CM | POA: Insufficient documentation

## 2015-05-25 DIAGNOSIS — Z79899 Other long term (current) drug therapy: Secondary | ICD-10-CM | POA: Diagnosis not present

## 2015-05-25 DIAGNOSIS — E780 Pure hypercholesterolemia: Secondary | ICD-10-CM | POA: Insufficient documentation

## 2015-05-25 DIAGNOSIS — I10 Essential (primary) hypertension: Secondary | ICD-10-CM | POA: Insufficient documentation

## 2015-05-25 DIAGNOSIS — C8290 Follicular lymphoma, unspecified, unspecified site: Secondary | ICD-10-CM

## 2015-05-25 LAB — BASIC METABOLIC PANEL
Anion gap: 9 (ref 5–15)
BUN: 30 mg/dL — ABNORMAL HIGH (ref 6–20)
CO2: 32 mmol/L (ref 22–32)
Calcium: 8.8 mg/dL — ABNORMAL LOW (ref 8.9–10.3)
Chloride: 96 mmol/L — ABNORMAL LOW (ref 101–111)
Creatinine, Ser: 1.3 mg/dL — ABNORMAL HIGH (ref 0.44–1.00)
GFR calc Af Amer: 42 mL/min — ABNORMAL LOW (ref >60)
GFR calc non Af Amer: 36 mL/min — ABNORMAL LOW (ref >60)
Glucose, Bld: 109 mg/dL — ABNORMAL HIGH (ref 65–99)
Potassium: 3.9 mmol/L (ref 3.5–5.1)
Sodium: 137 mmol/L (ref 135–145)

## 2015-05-25 LAB — URIC ACID: Uric Acid, Serum: 8.8 mg/dL — ABNORMAL HIGH (ref 2.3–6.6)

## 2015-05-26 ENCOUNTER — Telehealth: Payer: Self-pay | Admitting: Hematology and Oncology

## 2015-05-26 ENCOUNTER — Other Ambulatory Visit: Payer: Self-pay | Admitting: Hematology and Oncology

## 2015-05-26 DIAGNOSIS — C8290 Follicular lymphoma, unspecified, unspecified site: Secondary | ICD-10-CM

## 2015-05-26 LAB — HEPATITIS B SURFACE ANTIGEN: Hepatitis B Surface Ag: NEGATIVE

## 2015-05-26 MED ORDER — ALLOPURINOL 300 MG PO TABS: 300.0000 mg | ORAL_TABLET | Freq: Every day | ORAL | Status: DC

## 2015-05-26 NOTE — Telephone Encounter (Signed)
Re:  Uric acid  I called the patient today about her elevated uric acid.  I briefly discussed her diagnosis of lymphoma and issues with tumor lysis which may get worse with treatment (planned to start 05/30/2015).  I discussed adequate hydration and initiation of allopurinol.  A prescription was electronically sent to her pharmacy.    A call was also made to her daughter Lu Duffel (cell: [phone]).  Her mailbox was full.  No message was left.  Lequita Asal, MD

## 2015-05-28 ENCOUNTER — Encounter: Payer: Self-pay | Admitting: Hematology and Oncology

## 2015-05-28 NOTE — Patient Instructions (Signed)

## 2015-05-28 NOTE — Progress Notes (Signed)
[facility] day:  05/03/2015  Chief Complaint: Barbara Chavez is an 81 y.o. female  with a right shoulder mass who is seen for review of initial work-up and discussion regarding direction of therapy.  HPI: The patient was last seen in the medical oncology clinic on 04/20/2015 for initial consultation regarding a destructive lesion of the acromion with possible involvement of the distal clavicle and AC joint, and a geographic abnormal humeral head on shoulder MRI from MRI from 03/17/2015. Outside labs revealed a normocytic anemia.  We discussed a work-up including labs and imaging studies.  Labs included a normal SPEP, kappa and lambda free light chains, folate, and CEA.  Ferritin was 40 with a saturation of 10% (borderline low).  B12 was 283.  Reticulocyte count was 1.2%.  24 hour urine revealed no monoclonal protein.  There were slightly elevated kappa free light chains (28.50) but with a normal ratio.    Mammogram on 04/24/2015 revealed no evidence of malignancy.  Bone scan on 04/27/2015 revealed increased activity over both shoulders, most likely degenerative. There was some focal activity over the proximal left tibia.  There was a punctate area of activity over the anterior mid left rib.  PET scan on 04/26/2015 revealed hypermetabolic lymph nodes in the right inguinal, left periaortic, retroperitoneum and left axillary lymph region concerning for a lymphoproliferative process such as lymphoma.  Multiple myeloma would be a secondary consideration.  There was diffuse metabolic activity associated with the shoulders with symmetric fragmentation of the acromion on the left and right, thus favoring severe degenerative change, however cannot exclude multiple myeloma.  There was mild marrow activity within the proximal femurs which may relate to myeloma or anemia.  Symptomatically, she notes fatigue.  She denies any B symptoms.  Past Medical History   Diagnosis Date  . Hypertension   . Reflux   . Osteoarthritis   . Hypercholesterolemia   . Cancer     Past Surgical History  Procedure Laterality Date  . Cataract extraction    . Knee arthroscopy      Family History  Problem Relation Age of Onset  . Cancer Sister   . Breast cancer Sister     Social History:  reports that she has never smoked. She has never used smokeless tobacco. She reports that she does not drink alcohol or use illicit drugs.  The patient is accompanied by her daughter Barbara Chavez.  The patient lives by herself.  Her daughter is nearby.  She is a retired Oncologist.  She denies any exposure to radiation or toxins.  Allergies: No Known Allergies  Current Medications: Current Outpatient Prescriptions  Medication Sig Dispense Refill  . aspirin 81 MG chewable tablet Chew by mouth.    Marland Kitchen atorvastatin (LIPITOR) 40 MG tablet Take by mouth.    . busPIRone (BUSPAR) 15 MG tablet Take by mouth.    . calcium carbonate (CALCIUM 600) 600 MG TABS tablet Take by mouth.    . cetirizine (ZYRTEC) 10 MG tablet Take by mouth.    . Cholecalciferol (VITAMIN D3) 2000 UNITS capsule Take by mouth.    . citalopram (CELEXA) 20 MG tablet TAKE ONE TABLET BY MOUTH EVERY DAY    . cloNIDine (CATAPRES) 0.2 MG tablet TAKE ONE TABLET BY MOUTH TWICE A DAY FOR BLOOD PRESSURE    . donepezil (ARICEPT) 10 MG tablet Take by mouth.    . labetalol (NORMODYNE) 300 MG tablet TAKE TWO TABLETS BY  MOUTH TWICE DAILY    . pantoprazole (PROTONIX) 40 MG tablet Take by mouth.    . torsemide (DEMADEX) 20 MG tablet Take by mouth.    . traMADol (ULTRAM) 50 MG tablet TAKE ONE OR TWO TABLETS BY MOUTH EVERY EIGHT HOURS AS NEEDED FOR PAIN    . allopurinol (ZYLOPRIM) 300 MG tablet Take 1 tablet (300 mg total) by mouth daily. 30 tablet 1   No current facility-administered medications for this visit.   Review of Systems:  GENERAL:  Tired.  No fevers, sweats or weight loss. PERFORMANCE STATUS (ECOG):   2 HEENT:  New glasses.  No diplopia.  No runny nose, sore throat, mouth sores or tenderness. Lungs: No shortness of breath or cough.  No hemoptysis. Cardiac:  No chest pain, palpitations, orthopnea, or PND. GI:  No nausea, vomiting, diarrhea, constipation, melena or hematochezia. GU:  No urgency, frequency, dysuria, or hematuria. Musculoskeletal:  Right shoulder "sore".  Chronic back discomfort (improving).  Knee pain.  No muscle tenderness. Extremities:  Chronic lower extremity swelling. Skin:  No rashes or skin changes. Neuro:  No headache, numbness or weakness, balance or coordination issues. Endocrine:  No diabetes, thyroid issues, hot flashes or night sweats. Psych:  No mood changes, depression or anxiety. Pain:  No focal pain. Review of systems:  All other systems reviewed and found to be negative.  Physical Exam: Blood pressure 188/72, pulse 50, temperature 95.2 F (35.1 C), temperature source Tympanic, weight 211 lb 8.5 oz (95.95 kg), SpO2 97 %. GENERAL:  Well developed, well nourished, sitting comfortably in a wheelchair in the exam room in no acute distress. MENTAL STATUS:  Alert and oriented to person, place and time. HEAD:  Ow[place]ark styled hair.  Normocephalic, atraumatic, face symmetric, no Cushingoid features. EYES: Glasses.  Brown eyes.  No conjunctivitis or scleral icterus. ENT:  Hard of hearing.  EXTREMITIES:  Chronic lower extremity edema. NEUROLOGICAL: Unremarkable. PSYCH:  Appropriate.  Appointment on 05/03/2015  Component Date Value Ref Range Status  . WBC 05/03/2015 8.4  3.6 - 11.0 K/uL Final  . RBC 05/03/2015 3.62* 3.80 - 5.20 MIL/uL Final  . Hemoglobin 05/03/2015 10.2* 12.0 - 16.0 g/dL Final  . HCT 05/03/2015 31.7* 35.0 - 47.0 % Final  . MCV 05/03/2015 87.6  80.0 - 100.0 fL Final  . MCH 05/03/2015 28.2  26.0 - 34.0 pg Final  . MCHC 05/03/2015 32.2  32.0 - 36.0 g/dL Final  . RDW 05/03/2015 15.7* 11.5 - 14.5 % Final  . Platelets 05/03/2015 254  150 - 440 K/uL  Final  . Neutrophils Relative % 05/03/2015 69   Final  . Neutro Abs 05/03/2015 5.8  1.4 - 6.5 K/uL Final  . Lymphocytes Relative 05/03/2015 19   Final  . Lymphs Abs 05/03/2015 1.6  1.0 - 3.6 K/uL Final  . Monocytes Relative 05/03/2015 10   Final  . Monocytes Absolute 05/03/2015 0.9  0.2 - 0.9 K/uL Final  . Eosinophils Relative 05/03/2015 1   Final  . Eosinophils Absolute 05/03/2015 0.1  0 - 0.7 K/uL Final  . Basophils Relative 05/03/2015 1   Final  . Basophils Absolute 05/03/2015 0.1  0 - 0.1 K/uL Final  . Prothrombin Time 05/03/2015 14.0  11.4 - 15.0 seconds Final  . INR 05/03/2015 1.06   Final  . LDH 05/03/2015 217* 98 - 192 U/L Final  Office Visit on 05/03/2015  Component Date Value Ref Range Status  . Uric Acid, Serum 05/03/2015 7.6* 2.3 - 6.6 mg/dL Final  Assessment:  Barbara Chavez is an 81 y.o. female with a destructive lesion of the acromion with possible involvement of the distal clavicle and AC joint, and a geographic abnormal humeral head on shoulder MRI from 03/17/2015.  Labs from 04/11/2015 revealed a normocytic anemia.  Labs on 04/20/2015 included a normal SPEP, free light chains, folate, and CEA.  Ferritin was 40 with a saturation of 10% (borderline low).  B12 was 283.  Reticulocyte count was 1.2%.  24 hour urine revealed no monoclonal protein.  There were slightly elevated urine kappa free light chains (28.50) but with a normal ratio.    Mammogram on 04/24/2015 revealed no evidence of malignancy.  Bone scan on 04/27/2015 revealed increased activity over both shoulders, most likely degenerative. There was some focal activity over the proximal left tibia.  There was a punctate area of activity over the anterior mid left rib.  PET scan on 04/26/2015 revealed hypermetabolic lymph nodes in the right inguinal, left periaortic, retroperitoneum and left axillary lymph region concerning for a lymphoproliferative process such as lymphoma.  There was diffuse metabolic activity  associated with the shoulders with symmetric fragmentation of the acromion on the left and right, thus favoring severe degenerative change.  There was mild marrow activity within the proximal femurs which may relate to myeloma or anemia.  Symptomatically, she notes fatigue.  She denies any B symptoms.  Plan: 1.  Review work-up (labs and imaging studies).  Discuss concern for lymphoma.  Discuss obtaining lymph node biopsy.  Discuss obtaining ultrasound guided biopsy of right inguinal node.  Discuss labs today to evaluate for lymphoma. 2.  Labs today:  CBC with diff, LDH, uric acid, PT/INR. 3.  Schedule ultrasound guided right inguinal biopsy.  Send for pathology, flow cytometry, and cytogenetics. 4.  RTC after biopsy to discuss results and direction of therapy.   Lequita Asal, MD

## 2015-05-29 ENCOUNTER — Telehealth: Payer: Self-pay

## 2015-05-29 ENCOUNTER — Ambulatory Visit: Payer: Medicare Other

## 2015-05-29 ENCOUNTER — Inpatient Hospital Stay: Payer: Medicare Other

## 2015-05-29 ENCOUNTER — Inpatient Hospital Stay (HOSPITAL_BASED_OUTPATIENT_CLINIC_OR_DEPARTMENT_OTHER): Payer: Medicare Other | Admitting: Hematology and Oncology

## 2015-05-29 VITALS — BP 157/72 | HR 56 | Temp 96.0°F | Ht 61.0 in | Wt 206.8 lb

## 2015-05-29 DIAGNOSIS — R531 Weakness: Secondary | ICD-10-CM

## 2015-05-29 DIAGNOSIS — M25512 Pain in left shoulder: Secondary | ICD-10-CM | POA: Diagnosis not present

## 2015-05-29 DIAGNOSIS — M25511 Pain in right shoulder: Secondary | ICD-10-CM

## 2015-05-29 DIAGNOSIS — I10 Essential (primary) hypertension: Secondary | ICD-10-CM

## 2015-05-29 DIAGNOSIS — C8290 Follicular lymphoma, unspecified, unspecified site: Secondary | ICD-10-CM

## 2015-05-29 DIAGNOSIS — R5383 Other fatigue: Secondary | ICD-10-CM

## 2015-05-29 DIAGNOSIS — D649 Anemia, unspecified: Secondary | ICD-10-CM

## 2015-05-29 DIAGNOSIS — M1990 Unspecified osteoarthritis, unspecified site: Secondary | ICD-10-CM

## 2015-05-29 DIAGNOSIS — F0390 Unspecified dementia without behavioral disturbance: Secondary | ICD-10-CM

## 2015-05-29 DIAGNOSIS — Z5112 Encounter for antineoplastic immunotherapy: Secondary | ICD-10-CM | POA: Diagnosis not present

## 2015-05-29 DIAGNOSIS — K219 Gastro-esophageal reflux disease without esophagitis: Secondary | ICD-10-CM

## 2015-05-29 DIAGNOSIS — E780 Pure hypercholesterolemia: Secondary | ICD-10-CM

## 2015-05-29 DIAGNOSIS — Z79899 Other long term (current) drug therapy: Secondary | ICD-10-CM

## 2015-05-29 LAB — CBC WITH DIFFERENTIAL/PLATELET
Basophils Absolute: 0.1 10*3/uL (ref 0–0.1)
Basophils Relative: 1 %
Eosinophils Absolute: 0.2 10*3/uL (ref 0–0.7)
Eosinophils Relative: 2 %
HCT: 29.1 % — ABNORMAL LOW (ref 35.0–47.0)
Hemoglobin: 9.5 g/dL — ABNORMAL LOW (ref 12.0–16.0)
Lymphocytes Relative: 23 %
Lymphs Abs: 1.6 10*3/uL (ref 1.0–3.6)
MCH: 28.3 pg (ref 26.0–34.0)
MCHC: 32.5 g/dL (ref 32.0–36.0)
MCV: 87.1 fL (ref 80.0–100.0)
Monocytes Absolute: 0.9 10*3/uL (ref 0.2–0.9)
Monocytes Relative: 12 %
Neutro Abs: 4.3 10*3/uL (ref 1.4–6.5)
Neutrophils Relative %: 62 %
Platelets: 268 10*3/uL (ref 150–440)
RBC: 3.35 MIL/uL — ABNORMAL LOW (ref 3.80–5.20)
RDW: 15 % — ABNORMAL HIGH (ref 11.5–14.5)
WBC: 7 10*3/uL (ref 3.6–11.0)

## 2015-05-29 LAB — COMPREHENSIVE METABOLIC PANEL
ALT: 18 U/L (ref 14–54)
AST: 21 U/L (ref 15–41)
Albumin: 3.4 g/dL — ABNORMAL LOW (ref 3.5–5.0)
Alkaline Phosphatase: 120 U/L (ref 38–126)
Anion gap: 9 (ref 5–15)
BUN: 28 mg/dL — ABNORMAL HIGH (ref 6–20)
CO2: 34 mmol/L — ABNORMAL HIGH (ref 22–32)
Calcium: 8.7 mg/dL — ABNORMAL LOW (ref 8.9–10.3)
Chloride: 93 mmol/L — ABNORMAL LOW (ref 101–111)
Creatinine, Ser: 1.36 mg/dL — ABNORMAL HIGH (ref 0.44–1.00)
GFR calc Af Amer: 40 mL/min — ABNORMAL LOW (ref >60)
GFR calc non Af Amer: 34 mL/min — ABNORMAL LOW (ref >60)
Glucose, Bld: 111 mg/dL — ABNORMAL HIGH (ref 65–99)
Potassium: 3.6 mmol/L (ref 3.5–5.1)
Sodium: 136 mmol/L (ref 135–145)
Total Bilirubin: 0.5 mg/dL (ref 0.3–1.2)
Total Protein: 7.1 g/dL (ref 6.5–8.1)

## 2015-05-29 LAB — URIC ACID: Uric Acid, Serum: 8.2 mg/dL — ABNORMAL HIGH (ref 2.3–6.6)

## 2015-05-29 NOTE — Progress Notes (Signed)
Pt here today for evaluation prior to chemo tomorrow; attended chemo class today; decreased appetite; overall soreness in shoulders and arms to the touch; insomnia

## 2015-05-29 NOTE — Telephone Encounter (Signed)
Patients daughter Sofiya Ezelle called to let Dr. Mike Gip know that her Mom takes Benadryl 25mg  - 2 tabs at night. Dr. Mike Gip aware.

## 2015-05-29 NOTE — Progress Notes (Signed)
[facility] day:  05/29/2015  Chief Complaint: Barbara Chavez is an 81 y.o. female  with a right shoulder mass who is seen for review of initial work-up and discussion regarding direction of therapy.  HPI: The patient was last seen in the medical oncology clinic on 05/03/2015 for review of initial work-up.  PET scan revealed multiple hypermetabolic lymph nodes concerning for lymphoma.  We discussed ultrasound guided right inguinal lymph node biopsy.  Lymph node biopsy on 05/08/2015 revealed grade I follicular lymphoma.  Hepatitis B core antibody, hepatitis B surface antigen, and hepatitis C antibody were negative on 05/25/2015.  Symptomatically, she she denies any pain. She feels that her left shoulder is tender. She notes some sweats.    Past Medical History  Diagnosis Date  . Hypertension   . Reflux   . Osteoarthritis   . Hypercholesterolemia   . Cancer     Past Surgical History  Procedure Laterality Date  . Cataract extraction    . Knee arthroscopy      Family History  Problem Relation Age of Onset  . Cancer Sister   . Breast cancer Sister     Social History:  reports that she has never smoked. She has never used smokeless tobacco. She reports that she does not drink alcohol or use illicit drugs.  The patient is accompanied by her daughter [name].  The patient lives by herself.  Her daughter is nearby.  She is a retired Oncologist.  She denies any exposure to radiation or toxins.  Allergies: No Known Allergies  Current Medications: Current Outpatient Prescriptions  Medication Sig Dispense Refill  . allopurinol (ZYLOPRIM) 300 MG tablet Take 1 tablet (300 mg total) by mouth daily. 30 tablet 1  . aspirin 81 MG chewable tablet Chew by mouth.    Marland Kitchen atorvastatin (LIPITOR) 40 MG tablet Take by mouth.    . busPIRone (BUSPAR) 15 MG tablet Take by mouth.    . calcium carbonate (CALCIUM 600) 600 MG TABS tablet  Take by mouth.    . Cholecalciferol (VITAMIN D3) 2000 UNITS capsule Take by mouth.    . citalopram (CELEXA) 20 MG tablet TAKE ONE TABLET BY MOUTH EVERY DAY    . cloNIDine (CATAPRES) 0.2 MG tablet TAKE ONE TABLET BY MOUTH TWICE A DAY FOR BLOOD PRESSURE    . donepezil (ARICEPT) 10 MG tablet Take by mouth.    . FLOVENT DISKUS 100 MCG/BLIST AEPB     . ibuprofen (ADVIL,MOTRIN) 600 MG tablet     . labetalol (NORMODYNE) 300 MG tablet TAKE TWO TABLETS BY MOUTH TWICE DAILY    . pantoprazole (PROTONIX) 40 MG tablet Take by mouth.    . torsemide (DEMADEX) 20 MG tablet Take by mouth.    . traMADol (ULTRAM) 50 MG tablet TAKE ONE OR TWO TABLETS BY MOUTH EVERY EIGHT HOURS AS NEEDED FOR PAIN    . vitamin B-12 (CYANOCOBALAMIN) 250 MCG tablet Take 250 mcg by mouth daily.    . cetirizine (ZYRTEC) 10 MG tablet Take by mouth.     No current facility-administered medications for this visit.   Review of Systems:  GENERAL:  Gneneral fatigue.  No fevers or weight loss.  Some sweats. PERFORMANCE STATUS (ECOG):  2 HEENT:  New glasses.  No diplopia.  Hard of hearing.  No runny nose, sore throat, mouth sores or tenderness. Lungs: No shortness of breath or cough.  No hemoptysis. Cardiac:  No chest pain, palpitations,  orthopnea, or PND. GI:  No nausea, vomiting, diarrhea, constipation, melena or hematochezia. GU:  No urgency, frequency, dysuria, or hematuria. Musculoskeletal:  Right shoulder tender.  Chronic back discomfort (improving).  Knee pain.  No muscle tenderness. Extremities:  Chronic lower extremity swelling. Skin:  No rashes or skin changes. Neuro:  No headache, numbness or weakness, balance or coordination issues. Endocrine:  No diabetes, thyroid issues, hot flashes or night sweats. Psych:  No mood changes, depression or anxiety. Pain:  No focal pain. Review of systems:  All other systems reviewed and found to be negative.  Physical Exam: Blood pressure 157/72, pulse 56, temperature 96 F (35.6 C),  temperature source Tympanic, height 5' 1"  (1.549 m), weight 206 lb 12.7 oz (93.8 kg). GENERAL:  Well developed, well nourished, sitting comfortably in a wheelchair in the exam room in no acute distress. MENTAL STATUS:  Alert and oriented to person, place and time. HEAD:  Wearing a white cap.  Brown styled hair.  Normocephalic, atraumatic, face symmetric, no Cushingoid features. EYES: Glasses.  Brown eyes.  Pupils equal round and reactive to light and accomodation.  No conjunctivitis or scleral icterus. ENT:  Oropharynx clear without lesion.  Tongue normal. Mucous membranes moist.  RESPIRATORY:  Clear to auscultation without rales, wheezes or rhonchi. CARDIOVASCULAR:  Regular rate and rhythm without murmur, rub or gallop. ABDOMEN:  Soft, non-tender, with active bowel sounds, and no hepatosplenomegaly.  No masses. SKIN:  No rashes, ulcers or lesions. EXTREMITIES:  Chronic lower extremity edema. NEUROLOGICAL: Unremarkable. PSYCH:  Appropriate.  No visits with results within 3 Day(s) from this visit. Latest known visit with results is:  Appointment on 05/25/2015  Component Date Value Ref Range Status  . Hepatitis B Surface Ag 05/25/2015 Negative  Negative Final   Comment: (NOTE) Performed At: [facility] [place], [place] [id number] Lindon Romp MD FY:[id number]   . Sodium 05/25/2015 137  135 - 145 mmol/L Final  . Potassium 05/25/2015 3.9  3.5 - 5.1 mmol/L Final  . Chloride 05/25/2015 96* 101 - 111 mmol/L Final  . CO2 05/25/2015 32  22 - 32 mmol/L Final  . Glucose, Bld 05/25/2015 109* 65 - 99 mg/dL Final  . BUN 05/25/2015 30* 6 - 20 mg/dL Final  . Creatinine, Ser 05/25/2015 1.30* 0.44 - 1.00 mg/dL Final  . Calcium 05/25/2015 8.8* 8.9 - 10.3 mg/dL Final  . GFR calc non Af Amer 05/25/2015 36* >60 mL/min Final  . GFR calc Af Amer 05/25/2015 42* >60 mL/min Final   Comment: (NOTE) The eGFR has been calculated using the CKD EPI equation. This calculation has not  been validated in all clinical situations. eGFR's persistently <60 mL/min signify possible Chronic Kidney Disease.   . Anion gap 05/25/2015 9  5 - 15 Final  . Uric Acid, Serum 05/25/2015 8.8* 2.3 - 6.6 mg/dL Final    Assessment:  Verena Shawgo is an 81 y.o. female with a destructive lesion of the acromion with possible involvement of the distal clavicle and AC joint, and a geographic abnormal humeral head on shoulder MRI from 03/17/2015.  Labs from 04/11/2015 revealed a normocytic anemia.  Labs on 04/20/2015 included a normal SPEP, free light chains, folate, and CEA.  Ferritin was 40 with a saturation of 10% (borderline low).  B12 was 283.  Reticulocyte count was 1.2%.  24 hour urine revealed no monoclonal protein.  There were slightly elevated urine kappa free light chains (28.50) but with a normal ratio.    Mammogram  on 04/24/2015 revealed no evidence of malignancy.  Bone scan on 04/27/2015 revealed increased activity over both shoulders, most likely degenerative. There was some focal activity over the proximal left tibia.  There was a punctate area of activity over the anterior mid left rib.  PET scan on 04/26/2015 revealed hypermetabolic lymph nodes in the right inguinal, left periaortic, retroperitoneum and left axillary lymph region concerning for a lymphoproliferative process such as lymphoma.  There was diffuse metabolic activity associated with the shoulders with symmetric fragmentation of the acromion on the left and right, thus favoring severe degenerative change.  There was mild marrow activity within the proximal femurs which may relate to myeloma or anemia.  Lymph node biopsy on 05/08/2015 revealed grade I follicular lymphoma.  Hepatitis B core antibody, hepatitis B surface antigen, and hepatitis C antibody were negative on 05/25/2015.  Symptomatically, she has chronic fatigue.  Her shoulder is sore.  She has had some sweats.  Plan: 1.  Discuss results of lymph node biopsy.   Discuss stage III versus IV disease.  Discuss indications of treatment of follicular lymphoma.  Discuss unclear if right shoulder lesion is secondary to lymphoma (radiology does not believe it is related).  Review tumor board discussions. Discuss initiation of Rituxan weekly x 4.  Review potential side effects (allergic reaction, reaction of hepatitis B, progressive multifocal leukoencephalopathy (PML).  2.  Plan Rituxan 375 mg/m2 weekly x 4.  Patient consented to treatment. 3.  Labs today:  CBC with diff, CMP, LDH, uric acid, PT/INR. 3.  RTC tomorrow for Rituxan. 4.  RTC weekly x 3 on Wednesdays for labs (BMP and uric acid) and Rituxan 5.  RTC in 1 month for MD assessment and labs (CBC with diff, CMP, and uric acid).   Lequita Asal, MD  05/29/2015

## 2015-05-30 ENCOUNTER — Inpatient Hospital Stay: Payer: Medicare Other

## 2015-05-30 DIAGNOSIS — Z5112 Encounter for antineoplastic immunotherapy: Secondary | ICD-10-CM | POA: Diagnosis not present

## 2015-05-30 DIAGNOSIS — C8290 Follicular lymphoma, unspecified, unspecified site: Secondary | ICD-10-CM

## 2015-05-30 LAB — HEPATITIS B CORE ANTIBODY, TOTAL: Hep B Core Total Ab: NEGATIVE

## 2015-05-30 LAB — HEPATITIS C ANTIBODY: HCV Ab: 0.1 s/co ratio (ref 0.0–0.9)

## 2015-05-30 LAB — METHYLMALONIC ACID, SERUM: Methylmalonic Acid, Quantitative: 282 nmol/L (ref 0–378)

## 2015-05-30 MED ORDER — ACETAMINOPHEN 325 MG PO TABS
650.0000 mg | ORAL_TABLET | Freq: Once | ORAL | Status: AC
  Administered 2015-05-30: 650 mg via ORAL
  Filled 2015-05-30: qty 2

## 2015-05-30 MED ORDER — SODIUM CHLORIDE 0.9 % IV SOLN
375.0000 mg/m2 | Freq: Once | INTRAVENOUS | Status: AC
  Administered 2015-05-30: 800 mg via INTRAVENOUS
  Filled 2015-05-30: qty 70

## 2015-05-30 MED ORDER — DIPHENHYDRAMINE HCL 25 MG PO CAPS
50.0000 mg | ORAL_CAPSULE | Freq: Once | ORAL | Status: AC
  Administered 2015-05-30: 25 mg via ORAL
  Filled 2015-05-30: qty 2

## 2015-05-30 MED ORDER — SODIUM CHLORIDE 0.9 % IV SOLN
Freq: Once | INTRAVENOUS | Status: AC
  Administered 2015-05-30: 10:00:00 via INTRAVENOUS
  Filled 2015-05-30: qty 1000

## 2015-06-06 ENCOUNTER — Other Ambulatory Visit: Payer: Self-pay

## 2015-06-06 ENCOUNTER — Inpatient Hospital Stay: Payer: Medicare Other

## 2015-06-06 VITALS — BP 156/70 | HR 62 | Temp 95.9°F | Resp 20

## 2015-06-06 DIAGNOSIS — C8290 Follicular lymphoma, unspecified, unspecified site: Secondary | ICD-10-CM

## 2015-06-06 DIAGNOSIS — Z5112 Encounter for antineoplastic immunotherapy: Secondary | ICD-10-CM | POA: Diagnosis not present

## 2015-06-06 LAB — URIC ACID: Uric Acid, Serum: 5.7 mg/dL (ref 2.3–6.6)

## 2015-06-06 LAB — BASIC METABOLIC PANEL
Anion gap: 7 (ref 5–15)
BUN: 22 mg/dL — ABNORMAL HIGH (ref 6–20)
CO2: 33 mmol/L — ABNORMAL HIGH (ref 22–32)
Calcium: 8.7 mg/dL — ABNORMAL LOW (ref 8.9–10.3)
Chloride: 96 mmol/L — ABNORMAL LOW (ref 101–111)
Creatinine, Ser: 1.74 mg/dL — ABNORMAL HIGH (ref 0.44–1.00)
GFR calc Af Amer: 29 mL/min — ABNORMAL LOW (ref >60)
GFR calc non Af Amer: 25 mL/min — ABNORMAL LOW (ref >60)
Glucose, Bld: 107 mg/dL — ABNORMAL HIGH (ref 65–99)
Potassium: 3.6 mmol/L (ref 3.5–5.1)
Sodium: 136 mmol/L (ref 135–145)

## 2015-06-06 MED ORDER — SODIUM CHLORIDE 0.9 % IV SOLN
Freq: Once | INTRAVENOUS | Status: AC
  Administered 2015-06-06: 11:00:00 via INTRAVENOUS
  Filled 2015-06-06: qty 1000

## 2015-06-06 MED ORDER — SODIUM CHLORIDE 0.9 % IV SOLN: 375.0000 mg/m2 | Freq: Once | INTRAVENOUS | Status: DC

## 2015-06-06 MED ORDER — DIPHENHYDRAMINE HCL 25 MG PO CAPS
50.0000 mg | ORAL_CAPSULE | Freq: Once | ORAL | Status: AC
  Administered 2015-06-06: 50 mg via ORAL
  Filled 2015-06-06: qty 2

## 2015-06-06 MED ORDER — SODIUM CHLORIDE 0.9 % IV SOLN
375.0000 mg/m2 | Freq: Once | INTRAVENOUS | Status: AC
  Administered 2015-06-06: 800 mg via INTRAVENOUS
  Filled 2015-06-06: qty 70

## 2015-06-06 MED ORDER — ACETAMINOPHEN 325 MG PO TABS
650.0000 mg | ORAL_TABLET | Freq: Once | ORAL | Status: AC
  Administered 2015-06-06: 650 mg via ORAL
  Filled 2015-06-06: qty 2

## 2015-06-13 ENCOUNTER — Other Ambulatory Visit: Payer: Self-pay

## 2015-06-13 ENCOUNTER — Inpatient Hospital Stay: Payer: Medicare Other | Attending: Hematology and Oncology

## 2015-06-13 ENCOUNTER — Other Ambulatory Visit: Payer: Self-pay | Admitting: Family Medicine

## 2015-06-13 ENCOUNTER — Telehealth: Payer: Self-pay | Admitting: Pharmacist

## 2015-06-13 ENCOUNTER — Inpatient Hospital Stay: Payer: Medicare Other

## 2015-06-13 ENCOUNTER — Encounter: Payer: Self-pay | Admitting: Oncology

## 2015-06-13 VITALS — BP 180/76 | HR 72 | Temp 96.0°F | Resp 20

## 2015-06-13 DIAGNOSIS — C8290 Follicular lymphoma, unspecified, unspecified site: Secondary | ICD-10-CM

## 2015-06-13 DIAGNOSIS — K219 Gastro-esophageal reflux disease without esophagitis: Secondary | ICD-10-CM | POA: Diagnosis not present

## 2015-06-13 DIAGNOSIS — D649 Anemia, unspecified: Secondary | ICD-10-CM | POA: Insufficient documentation

## 2015-06-13 DIAGNOSIS — Z809 Family history of malignant neoplasm, unspecified: Secondary | ICD-10-CM | POA: Diagnosis not present

## 2015-06-13 DIAGNOSIS — C8200 Follicular lymphoma grade I, unspecified site: Secondary | ICD-10-CM | POA: Insufficient documentation

## 2015-06-13 DIAGNOSIS — R112 Nausea with vomiting, unspecified: Secondary | ICD-10-CM | POA: Diagnosis not present

## 2015-06-13 DIAGNOSIS — R197 Diarrhea, unspecified: Secondary | ICD-10-CM | POA: Insufficient documentation

## 2015-06-13 DIAGNOSIS — M1990 Unspecified osteoarthritis, unspecified site: Secondary | ICD-10-CM | POA: Insufficient documentation

## 2015-06-13 DIAGNOSIS — C4001 Malignant neoplasm of scapula and long bones of right upper limb: Secondary | ICD-10-CM

## 2015-06-13 DIAGNOSIS — Z7982 Long term (current) use of aspirin: Secondary | ICD-10-CM | POA: Insufficient documentation

## 2015-06-13 DIAGNOSIS — R531 Weakness: Secondary | ICD-10-CM | POA: Insufficient documentation

## 2015-06-13 DIAGNOSIS — Z803 Family history of malignant neoplasm of breast: Secondary | ICD-10-CM | POA: Diagnosis not present

## 2015-06-13 DIAGNOSIS — I10 Essential (primary) hypertension: Secondary | ICD-10-CM | POA: Insufficient documentation

## 2015-06-13 DIAGNOSIS — Z79899 Other long term (current) drug therapy: Secondary | ICD-10-CM | POA: Insufficient documentation

## 2015-06-13 DIAGNOSIS — Z5111 Encounter for antineoplastic chemotherapy: Secondary | ICD-10-CM | POA: Diagnosis present

## 2015-06-13 DIAGNOSIS — E860 Dehydration: Secondary | ICD-10-CM | POA: Insufficient documentation

## 2015-06-13 DIAGNOSIS — R630 Anorexia: Secondary | ICD-10-CM | POA: Diagnosis not present

## 2015-06-13 DIAGNOSIS — E780 Pure hypercholesterolemia: Secondary | ICD-10-CM | POA: Insufficient documentation

## 2015-06-13 LAB — BASIC METABOLIC PANEL
Anion gap: 5 (ref 5–15)
BUN: 25 mg/dL — ABNORMAL HIGH (ref 6–20)
CO2: 34 mmol/L — ABNORMAL HIGH (ref 22–32)
Calcium: 8.7 mg/dL — ABNORMAL LOW (ref 8.9–10.3)
Chloride: 97 mmol/L — ABNORMAL LOW (ref 101–111)
Creatinine, Ser: 1.61 mg/dL — ABNORMAL HIGH (ref 0.44–1.00)
GFR calc Af Amer: 32 mL/min — ABNORMAL LOW (ref >60)
GFR calc non Af Amer: 28 mL/min — ABNORMAL LOW (ref >60)
Glucose, Bld: 127 mg/dL — ABNORMAL HIGH (ref 65–99)
Potassium: 4 mmol/L (ref 3.5–5.1)
Sodium: 136 mmol/L (ref 135–145)

## 2015-06-13 LAB — URIC ACID: Uric Acid, Serum: 4.5 mg/dL (ref 2.3–6.6)

## 2015-06-13 MED ORDER — SODIUM CHLORIDE 0.9 % IV SOLN
Freq: Once | INTRAVENOUS | Status: AC
  Administered 2015-06-13: 10:00:00 via INTRAVENOUS
  Filled 2015-06-13: qty 1000

## 2015-06-13 MED ORDER — ACETAMINOPHEN 325 MG PO TABS
650.0000 mg | ORAL_TABLET | Freq: Once | ORAL | Status: AC
  Administered 2015-06-13: 650 mg via ORAL

## 2015-06-13 MED ORDER — SODIUM CHLORIDE 0.9 % IV SOLN
800.0000 mg | Freq: Once | INTRAVENOUS | Status: AC
  Administered 2015-06-13: 800 mg via INTRAVENOUS
  Filled 2015-06-13: qty 70

## 2015-06-13 MED ORDER — DIPHENHYDRAMINE HCL 25 MG PO CAPS
50.0000 mg | ORAL_CAPSULE | Freq: Once | ORAL | Status: AC
  Administered 2015-06-13: 50 mg via ORAL

## 2015-06-13 MED ORDER — SODIUM CHLORIDE 0.9 % IV SOLN: 375.0000 mg/m2 | Freq: Once | INTRAVENOUS | Status: DC

## 2015-06-13 NOTE — Telephone Encounter (Signed)
Dr. Oliva Bustard on call today for Dr. Mike Gip. Patient did not have CBC ordered. It has been two weeks since last CBC. Dr. Oliva Bustard was OK with going ahead with Rituxan treatment in the absence of a CBC today.

## 2015-06-18 ENCOUNTER — Telehealth: Payer: Self-pay

## 2015-06-18 NOTE — Telephone Encounter (Signed)
Spoke with pt's daughter she called at 105 I returned phone call @[phone]  regarding FMLA paperwork.  She asked if she needed to bring in old paperwork that has Dr. Kem Parkinson name on it because they are changing to Dr. Grayland Ormond.  I asked her to give me a minute and I would speak to his nurse.  RN suggested she bring in new paperwork at visit because MD would need to see pt before filling out paperwork.  Verbalized all this to pt's daughter and she verbalized an understanding and would bring it in next visit.

## 2015-06-20 ENCOUNTER — Inpatient Hospital Stay: Payer: Medicare Other

## 2015-06-20 ENCOUNTER — Other Ambulatory Visit: Payer: Self-pay | Admitting: Hematology and Oncology

## 2015-06-20 VITALS — BP 175/91 | HR 55 | Temp 95.5°F | Resp 20

## 2015-06-20 DIAGNOSIS — C8290 Follicular lymphoma, unspecified, unspecified site: Secondary | ICD-10-CM

## 2015-06-20 DIAGNOSIS — Z5111 Encounter for antineoplastic chemotherapy: Secondary | ICD-10-CM | POA: Diagnosis not present

## 2015-06-20 LAB — URIC ACID: Uric Acid, Serum: 4.1 mg/dL (ref 2.3–6.6)

## 2015-06-20 LAB — BASIC METABOLIC PANEL
Anion gap: 6 (ref 5–15)
BUN: 35 mg/dL — ABNORMAL HIGH (ref 6–20)
CO2: 31 mmol/L (ref 22–32)
Calcium: 8.9 mg/dL (ref 8.9–10.3)
Chloride: 100 mmol/L — ABNORMAL LOW (ref 101–111)
Creatinine, Ser: 1.5 mg/dL — ABNORMAL HIGH (ref 0.44–1.00)
GFR calc Af Amer: 35 mL/min — ABNORMAL LOW (ref >60)
GFR calc non Af Amer: 30 mL/min — ABNORMAL LOW (ref >60)
Glucose, Bld: 105 mg/dL — ABNORMAL HIGH (ref 65–99)
Potassium: 4.1 mmol/L (ref 3.5–5.1)
Sodium: 137 mmol/L (ref 135–145)

## 2015-06-20 MED ORDER — SODIUM CHLORIDE 0.9 % IV SOLN
Freq: Once | INTRAVENOUS | Status: AC
  Administered 2015-06-20: 10:00:00 via INTRAVENOUS
  Filled 2015-06-20: qty 1000

## 2015-06-20 MED ORDER — SODIUM CHLORIDE 0.9 % IV SOLN
800.0000 mg | Freq: Once | INTRAVENOUS | Status: AC
  Administered 2015-06-20: 800 mg via INTRAVENOUS
  Filled 2015-06-20: qty 70

## 2015-06-20 MED ORDER — DIPHENHYDRAMINE HCL 25 MG PO CAPS
50.0000 mg | ORAL_CAPSULE | Freq: Once | ORAL | Status: AC
  Administered 2015-06-20: 50 mg via ORAL
  Filled 2015-06-20: qty 2

## 2015-06-20 MED ORDER — ACETAMINOPHEN 325 MG PO TABS
650.0000 mg | ORAL_TABLET | Freq: Once | ORAL | Status: AC
  Administered 2015-06-20: 650 mg via ORAL
  Filled 2015-06-20: qty 2

## 2015-06-20 MED ORDER — SODIUM CHLORIDE 0.9 % IV SOLN: 375.0000 mg/m2 | Freq: Once | INTRAVENOUS | Status: DC

## 2015-06-25 ENCOUNTER — Telehealth: Payer: Self-pay | Admitting: *Deleted

## 2015-06-25 ENCOUNTER — Inpatient Hospital Stay: Payer: Medicare Other

## 2015-06-25 ENCOUNTER — Inpatient Hospital Stay (HOSPITAL_BASED_OUTPATIENT_CLINIC_OR_DEPARTMENT_OTHER): Payer: Medicare Other | Admitting: Internal Medicine

## 2015-06-25 VITALS — BP 194/79 | HR 80 | Temp 97.9°F | Resp 18 | Ht 61.0 in

## 2015-06-25 DIAGNOSIS — D649 Anemia, unspecified: Secondary | ICD-10-CM

## 2015-06-25 DIAGNOSIS — I10 Essential (primary) hypertension: Secondary | ICD-10-CM

## 2015-06-25 DIAGNOSIS — R112 Nausea with vomiting, unspecified: Secondary | ICD-10-CM | POA: Diagnosis not present

## 2015-06-25 DIAGNOSIS — R197 Diarrhea, unspecified: Secondary | ICD-10-CM | POA: Diagnosis not present

## 2015-06-25 DIAGNOSIS — K219 Gastro-esophageal reflux disease without esophagitis: Secondary | ICD-10-CM

## 2015-06-25 DIAGNOSIS — R1115 Cyclical vomiting syndrome unrelated to migraine: Secondary | ICD-10-CM

## 2015-06-25 DIAGNOSIS — E860 Dehydration: Secondary | ICD-10-CM

## 2015-06-25 DIAGNOSIS — C8590 Non-Hodgkin lymphoma, unspecified, unspecified site: Secondary | ICD-10-CM

## 2015-06-25 DIAGNOSIS — Z803 Family history of malignant neoplasm of breast: Secondary | ICD-10-CM

## 2015-06-25 DIAGNOSIS — Z809 Family history of malignant neoplasm, unspecified: Secondary | ICD-10-CM

## 2015-06-25 DIAGNOSIS — C8290 Follicular lymphoma, unspecified, unspecified site: Secondary | ICD-10-CM

## 2015-06-25 DIAGNOSIS — R531 Weakness: Secondary | ICD-10-CM

## 2015-06-25 DIAGNOSIS — M1990 Unspecified osteoarthritis, unspecified site: Secondary | ICD-10-CM

## 2015-06-25 DIAGNOSIS — Z7982 Long term (current) use of aspirin: Secondary | ICD-10-CM

## 2015-06-25 DIAGNOSIS — C8200 Follicular lymphoma grade I, unspecified site: Secondary | ICD-10-CM

## 2015-06-25 DIAGNOSIS — R630 Anorexia: Secondary | ICD-10-CM

## 2015-06-25 DIAGNOSIS — E780 Pure hypercholesterolemia: Secondary | ICD-10-CM

## 2015-06-25 DIAGNOSIS — Z79899 Other long term (current) drug therapy: Secondary | ICD-10-CM

## 2015-06-25 LAB — COMPREHENSIVE METABOLIC PANEL
ALT: 20 U/L (ref 14–54)
AST: 26 U/L (ref 15–41)
Albumin: 3.8 g/dL (ref 3.5–5.0)
Alkaline Phosphatase: 138 U/L — ABNORMAL HIGH (ref 38–126)
Anion gap: 10 (ref 5–15)
BUN: 18 mg/dL (ref 6–20)
CO2: 32 mmol/L (ref 22–32)
Calcium: 9.4 mg/dL (ref 8.9–10.3)
Chloride: 97 mmol/L — ABNORMAL LOW (ref 101–111)
Creatinine, Ser: 0.82 mg/dL (ref 0.44–1.00)
GFR calc Af Amer: >60 mL/min (ref >60)
GFR calc non Af Amer: >60 mL/min (ref >60)
Glucose, Bld: 179 mg/dL — ABNORMAL HIGH (ref 65–99)
Potassium: 3.6 mmol/L (ref 3.5–5.1)
Sodium: 139 mmol/L (ref 135–145)
Total Bilirubin: 0.5 mg/dL (ref 0.3–1.2)
Total Protein: 8.1 g/dL (ref 6.5–8.1)

## 2015-06-25 LAB — CBC WITH DIFFERENTIAL/PLATELET
Basophils Absolute: 0.1 10*3/uL (ref 0–0.1)
Basophils Relative: 1 %
EOS PCT: 0 %
Eosinophils Absolute: 0 10*3/uL (ref 0–0.7)
HCT: 29.1 % — ABNORMAL LOW (ref 35.0–47.0)
Hemoglobin: 9.6 g/dL — ABNORMAL LOW (ref 12.0–16.0)
LYMPHS ABS: 1.1 10*3/uL (ref 1.0–3.6)
LYMPHS PCT: 15 %
MCH: 28.6 pg (ref 26.0–34.0)
MCHC: 33.2 g/dL (ref 32.0–36.0)
MCV: 86.2 fL (ref 80.0–100.0)
MONO ABS: 0.4 10*3/uL (ref 0.2–0.9)
Monocytes Relative: 6 %
Neutro Abs: 5.7 10*3/uL (ref 1.4–6.5)
Neutrophils Relative %: 78 %
PLATELETS: 260 10*3/uL (ref 150–440)
RBC: 3.37 MIL/uL — ABNORMAL LOW (ref 3.80–5.20)
RDW: 16.1 % — AB (ref 11.5–14.5)
WBC: 7.3 10*3/uL (ref 3.6–11.0)

## 2015-06-25 LAB — MAGNESIUM: MAGNESIUM: 1.5 mg/dL — AB (ref 1.7–2.4)

## 2015-06-25 MED ORDER — PROMETHAZINE HCL 12.5 MG PO TABS: ORAL_TABLET | ORAL | Status: DC

## 2015-06-25 MED ORDER — SODIUM CHLORIDE 0.9 % IV SOLN
Freq: Once | INTRAVENOUS | Status: AC
  Administered 2015-06-25: 13:00:00 via INTRAVENOUS
  Filled 2015-06-25: qty 4

## 2015-06-25 MED ORDER — SODIUM CHLORIDE 0.45 % IV SOLN
Freq: Once | INTRAVENOUS | Status: AC
  Administered 2015-06-25: 13:00:00 via INTRAVENOUS
  Filled 2015-06-25: qty 1000

## 2015-06-25 MED ORDER — MAGNESIUM SULFATE 2 GM/50ML IV SOLN
2.0000 g | Freq: Once | INTRAVENOUS | Status: AC
  Administered 2015-06-25: 2 g via INTRAVENOUS
  Filled 2015-06-25: qty 50

## 2015-06-25 MED ORDER — METOCLOPRAMIDE HCL 5 MG/ML IJ SOLN
10.0000 mg | Freq: Once | INTRAMUSCULAR | Status: AC
  Administered 2015-06-25: 10 mg via INTRAVENOUS
  Filled 2015-06-25: qty 2

## 2015-06-25 NOTE — Progress Notes (Signed)
Patient states that last weekend she started having some pain in her abdomen. Patient states that around Saturday she began to have N&V and diarrhea. When this started the stomach pain went away. She states that she can not keep anything down, even water. She denies any pain at this time.

## 2015-06-25 NOTE — Progress Notes (Signed)
Called the infusion area and the pharmacy and added on 2 gm mag

## 2015-06-25 NOTE — Telephone Encounter (Signed)
After discussing with Dr Ma Hillock, pt agrees to come in at 11 this morning for lab/ md/ IVF

## 2015-06-26 ENCOUNTER — Inpatient Hospital Stay (HOSPITAL_BASED_OUTPATIENT_CLINIC_OR_DEPARTMENT_OTHER): Payer: Medicare Other | Admitting: Oncology

## 2015-06-26 ENCOUNTER — Inpatient Hospital Stay: Payer: Medicare Other

## 2015-06-26 VITALS — BP 180/76 | HR 73 | Temp 97.6°F | Resp 18

## 2015-06-26 DIAGNOSIS — R531 Weakness: Secondary | ICD-10-CM

## 2015-06-26 DIAGNOSIS — C8200 Follicular lymphoma grade I, unspecified site: Secondary | ICD-10-CM

## 2015-06-26 DIAGNOSIS — C8290 Follicular lymphoma, unspecified, unspecified site: Secondary | ICD-10-CM

## 2015-06-26 DIAGNOSIS — D649 Anemia, unspecified: Secondary | ICD-10-CM

## 2015-06-26 DIAGNOSIS — Z7982 Long term (current) use of aspirin: Secondary | ICD-10-CM

## 2015-06-26 DIAGNOSIS — Z79899 Other long term (current) drug therapy: Secondary | ICD-10-CM

## 2015-06-26 DIAGNOSIS — R110 Nausea: Secondary | ICD-10-CM

## 2015-06-26 DIAGNOSIS — E780 Pure hypercholesterolemia: Secondary | ICD-10-CM

## 2015-06-26 DIAGNOSIS — M1990 Unspecified osteoarthritis, unspecified site: Secondary | ICD-10-CM

## 2015-06-26 DIAGNOSIS — Z803 Family history of malignant neoplasm of breast: Secondary | ICD-10-CM

## 2015-06-26 DIAGNOSIS — Z809 Family history of malignant neoplasm, unspecified: Secondary | ICD-10-CM

## 2015-06-26 DIAGNOSIS — R630 Anorexia: Secondary | ICD-10-CM

## 2015-06-26 DIAGNOSIS — K219 Gastro-esophageal reflux disease without esophagitis: Secondary | ICD-10-CM

## 2015-06-26 DIAGNOSIS — I10 Essential (primary) hypertension: Secondary | ICD-10-CM

## 2015-06-26 MED ORDER — SODIUM CHLORIDE 0.9 % IV SOLN
Freq: Once | INTRAVENOUS | Status: DC
  Filled 2015-06-26: qty 4

## 2015-06-26 MED ORDER — SODIUM CHLORIDE 0.9 % IV SOLN
Freq: Once | INTRAVENOUS | Status: DC
  Filled 2015-06-26: qty 1000

## 2015-06-26 MED ORDER — SODIUM CHLORIDE 0.9 % IV SOLN
Freq: Once | INTRAVENOUS | Status: AC
  Administered 2015-06-26: 12:00:00 via INTRAVENOUS
  Filled 2015-06-26: qty 4

## 2015-06-26 MED ORDER — SODIUM CHLORIDE 0.9 % IV SOLN
INTRAVENOUS | Status: DC
  Administered 2015-06-26: 12:00:00 via INTRAVENOUS
  Filled 2015-06-26: qty 1000

## 2015-06-26 MED ORDER — PROCHLORPERAZINE MALEATE 10 MG PO TABS: 10.0000 mg | ORAL_TABLET | Freq: Four times a day (QID) | ORAL | Status: DC | PRN

## 2015-06-26 NOTE — Progress Notes (Signed)
Patient here today for follow up after experiencing abdominal pain and nausea over the weekend. Patient reports improvement in pain today, reports continued weakness.

## 2015-06-27 ENCOUNTER — Encounter: Payer: Self-pay | Admitting: *Deleted

## 2015-06-27 ENCOUNTER — Inpatient Hospital Stay
Admission: EM | Admit: 2015-06-27 | Discharge: 2015-06-30 | DRG: 641 | Disposition: A | Discharge status: Skilled Nursing Facility | Payer: Medicare Other | Attending: Internal Medicine | Admitting: Internal Medicine

## 2015-06-27 ENCOUNTER — Other Ambulatory Visit: Payer: Self-pay

## 2015-06-27 DIAGNOSIS — Z9889 Other specified postprocedural states: Secondary | ICD-10-CM

## 2015-06-27 DIAGNOSIS — Y92009 Unspecified place in unspecified non-institutional (private) residence as the place of occurrence of the external cause: Secondary | ICD-10-CM | POA: Diagnosis not present

## 2015-06-27 DIAGNOSIS — Z79899 Other long term (current) drug therapy: Secondary | ICD-10-CM | POA: Diagnosis not present

## 2015-06-27 DIAGNOSIS — I10 Essential (primary) hypertension: Secondary | ICD-10-CM | POA: Diagnosis present

## 2015-06-27 DIAGNOSIS — E860 Dehydration: Secondary | ICD-10-CM | POA: Diagnosis present

## 2015-06-27 DIAGNOSIS — Z9221 Personal history of antineoplastic chemotherapy: Secondary | ICD-10-CM

## 2015-06-27 DIAGNOSIS — E871 Hypo-osmolality and hyponatremia: Principal | ICD-10-CM

## 2015-06-27 DIAGNOSIS — K219 Gastro-esophageal reflux disease without esophagitis: Secondary | ICD-10-CM | POA: Diagnosis present

## 2015-06-27 DIAGNOSIS — I959 Hypotension, unspecified: Secondary | ICD-10-CM | POA: Diagnosis present

## 2015-06-27 DIAGNOSIS — C8590 Non-Hodgkin lymphoma, unspecified, unspecified site: Secondary | ICD-10-CM | POA: Diagnosis present

## 2015-06-27 DIAGNOSIS — W19XXXA Unspecified fall, initial encounter: Secondary | ICD-10-CM | POA: Diagnosis present

## 2015-06-27 DIAGNOSIS — N179 Acute kidney failure, unspecified: Secondary | ICD-10-CM | POA: Diagnosis present

## 2015-06-27 DIAGNOSIS — E780 Pure hypercholesterolemia: Secondary | ICD-10-CM | POA: Diagnosis present

## 2015-06-27 DIAGNOSIS — Z803 Family history of malignant neoplasm of breast: Secondary | ICD-10-CM | POA: Diagnosis not present

## 2015-06-27 DIAGNOSIS — M1990 Unspecified osteoarthritis, unspecified site: Secondary | ICD-10-CM | POA: Diagnosis present

## 2015-06-27 DIAGNOSIS — Z7982 Long term (current) use of aspirin: Secondary | ICD-10-CM | POA: Diagnosis not present

## 2015-06-27 DIAGNOSIS — Z9849 Cataract extraction status, unspecified eye: Secondary | ICD-10-CM

## 2015-06-27 LAB — URINALYSIS COMPLETE WITH MICROSCOPIC (ARMC ONLY)
BILIRUBIN URINE: NEGATIVE
Bacteria, UA: NONE SEEN
GLUCOSE, UA: NEGATIVE mg/dL
KETONES UR: NEGATIVE mg/dL
Leukocytes, UA: NEGATIVE
NITRITE: NEGATIVE
PH: 6 (ref 5.0–8.0)
Protein, ur: >500 mg/dL — AB
Specific Gravity, Urine: 1.019 (ref 1.005–1.030)

## 2015-06-27 LAB — CBC
HCT: 29.8 % — ABNORMAL LOW (ref 35.0–47.0)
HEMOGLOBIN: 9.7 g/dL — AB (ref 12.0–16.0)
MCH: 28.5 pg (ref 26.0–34.0)
MCHC: 32.6 g/dL (ref 32.0–36.0)
MCV: 87.5 fL (ref 80.0–100.0)
PLATELETS: 264 10*3/uL (ref 150–440)
RBC: 3.41 MIL/uL — AB (ref 3.80–5.20)
RDW: 16.4 % — ABNORMAL HIGH (ref 11.5–14.5)
WBC: 9.5 10*3/uL (ref 3.6–11.0)

## 2015-06-27 LAB — BASIC METABOLIC PANEL
ANION GAP: 11 (ref 5–15)
BUN: 17 mg/dL (ref 6–20)
CALCIUM: 8.5 mg/dL — AB (ref 8.9–10.3)
CO2: 26 mmol/L (ref 22–32)
Chloride: 92 mmol/L — ABNORMAL LOW (ref 101–111)
Creatinine, Ser: 1.31 mg/dL — ABNORMAL HIGH (ref 0.44–1.00)
GFR, EST AFRICAN AMERICAN: 41 mL/min — AB (ref >60)
GFR, EST NON AFRICAN AMERICAN: 36 mL/min — AB (ref >60)
Glucose, Bld: 120 mg/dL — ABNORMAL HIGH (ref 65–99)
Potassium: 3.4 mmol/L — ABNORMAL LOW (ref 3.5–5.1)
SODIUM: 129 mmol/L — AB (ref 135–145)

## 2015-06-27 LAB — GLUCOSE, CAPILLARY: Glucose-Capillary: 121 mg/dL — ABNORMAL HIGH (ref 65–99)

## 2015-06-27 MED ORDER — ASPIRIN EC 81 MG PO TBEC
81.0000 mg | DELAYED_RELEASE_TABLET | Freq: Every day | ORAL | Status: DC
  Administered 2015-06-28 – 2015-06-30 (×3): 81 mg via ORAL
  Filled 2015-06-27 (×3): qty 1

## 2015-06-27 MED ORDER — TRAMADOL HCL 50 MG PO TABS: 50.0000 mg | ORAL_TABLET | Freq: Three times a day (TID) | ORAL | Status: DC | PRN

## 2015-06-27 MED ORDER — DOCUSATE SODIUM 100 MG PO CAPS
100.0000 mg | ORAL_CAPSULE | ORAL | Status: DC
  Administered 2015-06-28 – 2015-06-30 (×2): 100 mg via ORAL
  Filled 2015-06-27 (×2): qty 1

## 2015-06-27 MED ORDER — ACETAMINOPHEN 650 MG RE SUPP: 650.0000 mg | Freq: Four times a day (QID) | RECTAL | Status: DC | PRN

## 2015-06-27 MED ORDER — PROCHLORPERAZINE MALEATE 10 MG PO TABS
10.0000 mg | ORAL_TABLET | Freq: Four times a day (QID) | ORAL | Status: DC | PRN
  Filled 2015-06-27: qty 1

## 2015-06-27 MED ORDER — ONDANSETRON HCL 4 MG PO TABS: 4.0000 mg | ORAL_TABLET | Freq: Four times a day (QID) | ORAL | Status: DC | PRN

## 2015-06-27 MED ORDER — LORATADINE 10 MG PO TABS
10.0000 mg | ORAL_TABLET | Freq: Every day | ORAL | Status: DC
Start: 2015-06-28 — End: 2015-06-30
  Administered 2015-06-28 – 2015-06-30 (×3): 10 mg via ORAL
  Filled 2015-06-27 (×2): qty 1

## 2015-06-27 MED ORDER — POTASSIUM CHLORIDE CRYS ER 20 MEQ PO TBCR
20.0000 meq | EXTENDED_RELEASE_TABLET | Freq: Every day | ORAL | Status: DC
  Administered 2015-06-27 – 2015-06-29 (×3): 20 meq via ORAL
  Filled 2015-06-27 (×4): qty 1

## 2015-06-27 MED ORDER — VITAMIN B-12 1000 MCG PO TABS
1000.0000 ug | ORAL_TABLET | Freq: Every day | ORAL | Status: DC
  Administered 2015-06-28 – 2015-06-30 (×3): 1000 ug via ORAL
  Filled 2015-06-27 (×3): qty 1

## 2015-06-27 MED ORDER — DONEPEZIL HCL 5 MG PO TABS
10.0000 mg | ORAL_TABLET | Freq: Every day | ORAL | Status: DC
  Administered 2015-06-27 – 2015-06-29 (×3): 10 mg via ORAL
  Filled 2015-06-27 (×4): qty 2

## 2015-06-27 MED ORDER — BUSPIRONE HCL 10 MG PO TABS
30.0000 mg | ORAL_TABLET | Freq: Two times a day (BID) | ORAL | Status: DC
  Administered 2015-06-27 – 2015-06-30 (×6): 30 mg via ORAL
  Filled 2015-06-27 (×6): qty 3

## 2015-06-27 MED ORDER — ALLOPURINOL 100 MG PO TABS
300.0000 mg | ORAL_TABLET | Freq: Every day | ORAL | Status: DC
  Administered 2015-06-28 – 2015-06-30 (×3): 300 mg via ORAL
  Filled 2015-06-27 (×3): qty 3

## 2015-06-27 MED ORDER — ACETAMINOPHEN 325 MG PO TABS: 650.0000 mg | ORAL_TABLET | Freq: Four times a day (QID) | ORAL | Status: DC | PRN

## 2015-06-27 MED ORDER — BUDESONIDE 0.25 MG/2ML IN SUSP: 0.2500 mg | Freq: Two times a day (BID) | RESPIRATORY_TRACT | Status: DC | PRN

## 2015-06-27 MED ORDER — ATORVASTATIN CALCIUM 20 MG PO TABS
40.0000 mg | ORAL_TABLET | Freq: Every day | ORAL | Status: DC
Start: 2015-06-27 — End: 2015-06-30
  Administered 2015-06-27 – 2015-06-29 (×3): 40 mg via ORAL
  Filled 2015-06-27 (×3): qty 2

## 2015-06-27 MED ORDER — ONDANSETRON HCL 4 MG/2ML IJ SOLN: 4.0000 mg | Freq: Four times a day (QID) | INTRAMUSCULAR | Status: DC | PRN

## 2015-06-27 MED ORDER — CITALOPRAM HYDROBROMIDE 20 MG PO TABS
20.0000 mg | ORAL_TABLET | Freq: Every day | ORAL | Status: DC
  Administered 2015-06-28 – 2015-06-30 (×3): 20 mg via ORAL
  Filled 2015-06-27 (×3): qty 1

## 2015-06-27 MED ORDER — ENOXAPARIN SODIUM 40 MG/0.4ML ~~LOC~~ SOLN
40.0000 mg | SUBCUTANEOUS | Status: DC
  Administered 2015-06-27 – 2015-06-29 (×3): 40 mg via SUBCUTANEOUS
  Filled 2015-06-27 (×3): qty 0.4

## 2015-06-27 MED ORDER — SODIUM CHLORIDE 0.9 % IV SOLN
INTRAVENOUS | Status: DC
  Administered 2015-06-27 – 2015-06-28 (×2): via INTRAVENOUS

## 2015-06-27 MED ORDER — FLUTICASONE PROPIONATE (INHAL) 100 MCG/BLIST IN AEPB: 1.0000 | INHALATION_SPRAY | Freq: Two times a day (BID) | RESPIRATORY_TRACT | Status: DC | PRN

## 2015-06-27 NOTE — ED Notes (Signed)
Daughter at bedside, daughter states pt is being actively tx for non hodgkins lymphoma, daughter states pt has not been eating and drinking and vomitng since Saturday, daughter states today pt became lethargic, pt received fluids yesterday and the day before, pt arousable to verbal stimuli

## 2015-06-27 NOTE — H&P (Signed)
[facility] [place] NAME: Barbara Chavez    MR#:  [id number]  DATE OF BIRTH:  1927-06-26  DATE OF ADMISSION:  06/27/2015  PRIMARY CARE PHYSICIAN: Kirk Ruths., MD   REQUESTING/REFERRING PHYSICIAN: Dr Archie Balboa  CHIEF COMPLAINT:   Generalized weakness, nausea vomiting and diarrhea for 4 days. HISTORY OF PRESENT ILLNESS:  Barbara Chavez  is a 81 y.o. female with a known history of  non-Hodgkin's lymphoma undergoing active chemotherapy at the cancer center, osteoarthritis, hypercholesterolemia comes to the emergency room after she started feeling very weak secondary to nausea vomiting and diarrhea for last several days. Patient is unable to keep anything orally. She's been sleeping excessively per daughter was present at bedside in the emergency room.  In the emergency room patient was found  to be hemodynamically stable however was found to have sodium of 129. Patient is extremely weak and lethargic and fatigued. She is being admitted for further evaluation and management.   AST MEDICAL HISTORY:   Past Medical History  Diagnosis Date  . Hypertension   . Reflux   . Osteoarthritis   . Hypercholesterolemia   . Cancer     PAST SURGICAL HISTOIRY:   Past Surgical History  Procedure Laterality Date  . Cataract extraction    . Knee arthroscopy      SOCIAL HISTORY:   Social History  Substance Use Topics  . Smoking status: Never Smoker   . Smokeless tobacco: Never Used  . Alcohol Use: No    FAMILY HISTORY:   Family History  Problem Relation Age of Onset  . Cancer Sister   . Breast cancer Sister     DRUG ALLERGIES:  No Known Allergies  REVIEW OF SYSTEMS:  Review of Systems  Constitutional: Positive for malaise/fatigue. Negative for fever, chills and diaphoresis.  HENT: Negative for congestion, ear pain, hearing loss, nosebleeds and sore throat.   Eyes: Negative for blurred vision, double vision, photophobia and  pain.  Respiratory: Negative for hemoptysis, sputum production, wheezing and stridor.   Cardiovascular: Negative for orthopnea, claudication and leg swelling.  Gastrointestinal: Positive for nausea and vomiting. Negative for heartburn and abdominal pain.  Genitourinary: Negative for dysuria and frequency.  Musculoskeletal: Negative for back pain, joint pain and neck pain.  Skin: Negative for rash.  Neurological: Positive for weakness. Negative for tingling, sensory change, speech change, focal weakness, seizures and headaches.  Endo/Heme/Allergies: Does not bruise/bleed easily.  Psychiatric/Behavioral: Negative for memory loss. The patient is not nervous/anxious.   All other systems reviewed and are negative.    MEDICATIONS AT HOME:   Prior to Admission medications   Medication Sig Start Date End Date Taking? Authorizing Provider  allopurinol (ZYLOPRIM) 300 MG tablet Take 1 tablet (300 mg total) by mouth daily. 05/26/15  Yes Lequita Asal, MD  aspirin EC 81 MG tablet Take 81 mg by mouth daily.   Yes Historical Provider, MD  atorvastatin (LIPITOR) 40 MG tablet Take 40 mg by mouth at bedtime.   Yes Historical Provider, MD  busPIRone (BUSPAR) 15 MG tablet Take 30 mg by mouth 2 (two) times daily.   Yes Historical Provider, MD  cetirizine (ZYRTEC) 10 MG tablet Take 10 mg by mouth daily as needed for allergies.   Yes Historical Provider, MD  citalopram (CELEXA) 20 MG tablet Take 20 mg by mouth daily.   Yes Historical Provider, MD  cloNIDine (CATAPRES) 0.2 MG tablet Take 0.2 mg by mouth 2 (two) times daily.  Yes Historical Provider, MD  docusate sodium (COLACE) 100 MG capsule Take 100 mg by mouth every other day.   Yes Historical Provider, MD  donepezil (ARICEPT) 10 MG tablet Take 10 mg by mouth at bedtime.   Yes Historical Provider, MD  Fluticasone Propionate, Inhal, (FLOVENT DISKUS) 100 MCG/BLIST AEPB Inhale 1 puff into the lungs 2 (two) times daily as needed (for shortness of breath).    Yes Historical Provider, MD  labetalol (NORMODYNE) 300 MG tablet Take 600 mg by mouth 2 (two) times daily.   Yes Historical Provider, MD  potassium gluconate 595 MG TABS tablet Take 595 mg by mouth daily.   Yes Historical Provider, MD  prochlorperazine (COMPAZINE) 10 MG tablet Take 1 tablet (10 mg total) by mouth every 6 (six) hours as needed for nausea or vomiting. 06/26/15  Yes Lloyd Huger, MD  promethazine (PHENERGAN) 12.5 MG tablet 1-2 tablets evey 6 hours prn for nausea and vomiting Patient taking differently: Take 12.5-25 mg by mouth every 6 (six) hours as needed for nausea or vomiting.  06/25/15  Yes Leia Alf, MD  torsemide (DEMADEX) 20 MG tablet Take 20 mg by mouth daily.   Yes Historical Provider, MD  traMADol (ULTRAM) 50 MG tablet Take 50-100 mg by mouth every 8 (eight) hours as needed for moderate pain.   Yes Historical Provider, MD  vitamin B-12 (CYANOCOBALAMIN) 1000 MCG tablet Take 1,000 mcg by mouth daily.   Yes Historical Provider, MD      VITAL SIGNS:  Blood pressure 135/57, pulse 72, resp. rate 18, SpO2 96 %.  PHYSICAL EXAMINATION:  GENERAL:  81 y.o.-year-old patient lying in the bed with no acute distress.  EYES: Pupils equal, round, reactive to light and accommodation. No scleral icterus. Extraocular muscles intact.  HEENT: Head atraumatic, normocephalic. Oropharynx and nasopharynx clear. Oral mucosa dry. NECK:  Supple, no jugular venous distention. No thyroid enlargement, no tenderness.  LUNGS: Normal breath sounds bilaterally, no wheezing, rales,rhonchi or crepitation. No use of accessory muscles of respiration.  CARDIOVASCULAR: S1, S2 normal. No murmurs, rubs, or gallops.  ABDOMEN: Soft, nontender, nondistended. Bowel sounds present. No organomegaly or mass.  EXTREMITIES: bilateral chronic pedal edema, no cyanosis, or clubbing.  NEUROLOGIC: Cranial nerves II through XII are intact. Muscle strength 5/5 in all extremities. Sensation intact. Gait not checked.   PSYCHIATRIC: The patient is alert and oriented x 3.  SKIN: No obvious rash, lesion, or ulcer.   LABORATORY PANEL:   CBC  Recent Labs Lab 06/27/15 1728  WBC 9.5  HGB 9.7*  HCT 29.8*  PLT 264   ------------------------------------------------------------------------------------------------------------------  Chemistries   Recent Labs Lab 06/25/15 1139 06/27/15 1728  NA 139 129*  K 3.6 3.4*  CL 97* 92*  CO2 32 26  GLUCOSE 179* 120*  BUN 18 17  CREATININE 0.82 1.31*  CALCIUM 9.4 8.5*  MG 1.5*  --   AST 26  --   ALT 20  --   ALKPHOS 138*  --   BILITOT 0.5  --    ------------------------------------------------------------------------------------------------------------------  Cardiac Enzymes No results for input(s): TROPONINI in the last 168 hours. ------------------------------------------------------------------------------------------------------------------  RADIOLOGY:  No results found.  EKG:  NSR minimal LVH  IMPRESSION AND PLAN:  81 year old [name] with past medical history of non-Hodgkin's lymphoma undergoing chemotherapy at the cancer center, osteoarthritis comes to the emergency room with nausea vomiting and diarrhea for last several days. She was found to have  1. Acute hyponatremia secondary to dehydration from nausea vomiting. Admit to medical  floor. IV fluids with normal saline. I's and O's monitoring BMP.  2. Acute renal failure/dehydration in the setting of nausea vomiting Treatment as above.  3 generalized weakness fatigue with fall at home per daughter Physical therapy to see in the morning  4. Non-Hodgkin's lymphoma per oncology as outpatient.  5. Relative hypotension hold her blood pressure medicine at present.  6 DVT prophylaxis subcutaneous Lovenox  Management for discharge planning  physical therapy consult.  All the records are reviewed and case discussed with ED provider. Management plans discussed with the patient,  family and they are in agreement.  CODE STATUS: FULL  TOTAL TIME TAKING CARE OF THIS PATIENT: 50 minutes.    Aunesti Pellegrino M.D on 06/27/2015 at 7:31 PM  Between 7am to 6pm - Pager - [phone]  After 6pm go to [email or web address] - password [email or web address] [facility]  Office  [phone]  CC: Primary care physician; Kirk Ruths., MD

## 2015-06-27 NOTE — ED Notes (Signed)
Pt brought in via ems from home.  Daughter reports pt has had vomiting for several days.  Pt has hodgkins lymphoma.  Treated at [facility].  Pt denies abd pain.

## 2015-06-27 NOTE — ED Provider Notes (Signed)
[facility] Emergency Department Provider Note  ____________________________________________  Time seen: 1735  I have reviewed the triage vital signs and the nursing notes.   HISTORY  Chief Complaint Emesis and Weakness   History limited by: fatigue, some history obtained from daughter   HPI Barbara Chavez is a 81 y.o. female who presents to the emergency department today because of concerns for fatigue and dehydration. The patient has a history of Hodgkin's lymphoma and is currently undergoing treatment for that. Daughter states that she thinks that the patient might have picked up a virus over the weekend at a barbecue. Since that time the patient has had both vomiting and diarrhea. She was seen at the cancer center and has been receiving fluids the past 2 days. The daughter states that the patient however has had okay energy until today. Patient states that she has been simply much more tired and harder to arouse today. The daughter is not appreciated that the patient has been in any pain with any of the vomiting or diarrhea.   Past Medical History  Diagnosis Date  . Hypertension   . Reflux   . Osteoarthritis   . Hypercholesterolemia   . Cancer     Patient Active Problem List   Diagnosis Date Noted  . Follicular non-Hodgkin's lymphoma 05/24/2015  . Adenopathy 05/03/2015  . Anemia 04/20/2015  . 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 Humerus lesion, right 04/20/2015    Past Surgical History  Procedure Laterality Date  . Cataract extraction    . Knee arthroscopy      Current Outpatient Rx  Name  Route  Sig  Dispense  Refill  . allopurinol (ZYLOPRIM) 300 MG tablet   Oral   Take 1 tablet (300 mg total) by mouth daily.   30 tablet   1   . aspirin 81 MG chewable tablet   Oral   Chew by mouth.         Marland Kitchen atorvastatin (LIPITOR) 40 MG tablet   Oral   Take by mouth.         . busPIRone (BUSPAR) 15 MG tablet   Oral   Take by mouth.          . calcium carbonate (CALCIUM 600) 600 MG TABS tablet   Oral   Take by mouth.         . EXPIRED: cetirizine (ZYRTEC) 10 MG tablet   Oral   Take by mouth.         . Cholecalciferol (VITAMIN D3) 2000 UNITS capsule   Oral   Take by mouth.         . citalopram (CELEXA) 20 MG tablet      TAKE ONE TABLET BY MOUTH EVERY DAY         . cloNIDine (CATAPRES) 0.2 MG tablet      TAKE ONE TABLET BY MOUTH TWICE A DAY FOR BLOOD PRESSURE         . donepezil (ARICEPT) 10 MG tablet   Oral   Take by mouth.         . FLOVENT DISKUS 100 MCG/BLIST AEPB                 Dispense as written.   Marland Kitchen ibuprofen (ADVIL,MOTRIN) 600 MG tablet               . labetalol (NORMODYNE) 300 MG tablet      TAKE TWO TABLETS BY MOUTH TWICE DAILY         .  pantoprazole (PROTONIX) 40 MG tablet   Oral   Take by mouth.         . prochlorperazine (COMPAZINE) 10 MG tablet   Oral   Take 1 tablet (10 mg total) by mouth every 6 (six) hours as needed for nausea or vomiting.   30 tablet   0   . promethazine (PHENERGAN) 12.5 MG tablet      1-2 tablets evey 6 hours prn for nausea and vomiting   60 tablet   0   . torsemide (DEMADEX) 20 MG tablet   Oral   Take by mouth.         . traMADol (ULTRAM) 50 MG tablet      TAKE ONE OR TWO TABLETS BY MOUTH EVERY EIGHT HOURS AS NEEDED FOR PAIN         . vitamin B-12 (CYANOCOBALAMIN) 250 MCG tablet   Oral   Take 250 mcg by mouth daily.           Allergies Review of patient's allergies indicates no known allergies.  Family History  Problem Relation Age of Onset  . Cancer Sister   . Breast cancer Sister     Social History Social History  Substance Use Topics  . Smoking status: Never Smoker   . Smokeless tobacco: Never Used  . Alcohol Use: No    Review of Systems  Constitutional: Negative for fever. Cardiovascular: Negative for chest pain. Respiratory: Negative for shortness of breath. Gastrointestinal: Positive for  vomiting and diarrhea Genitourinary: Negative for dysuria. Musculoskeletal: Negative for back pain. Skin: Negative for rash. Neurological: Negative for headaches, focal weakness or numbness.  10-point ROS otherwise negative.  ____________________________________________   PHYSICAL EXAM:  VITAL SIGNS:  BP: 105/58 mmHg (Device Time: 17:46:27) ; MAP (mmHg): 73 (Device Time: 17:46:27) ; Resp: 22 (Device Time: 17:46:20) ; SpO2: 98 % (Device Time: 17:46:20) ; ECG Heart Rate: 82   Constitutional: Asleep, but wakes up easily to verbal stimuli. Oriented.  Eyes: Conjunctivae are normal. PERRL. Normal extraocular movements. ENT   Head: Normocephalic and atraumatic.   Nose: No congestion/rhinnorhea.   Mouth/Throat: Mucous membranes are moist.   Neck: No stridor. Hematological/Lymphatic/Immunilogical: No cervical lymphadenopathy. Cardiovascular: Normal rate, regular rhythm.  No murmurs, rubs, or gallops. Respiratory: Normal respiratory effort without tachypnea nor retractions. Breath sounds are clear and equal bilaterally. No wheezes/rales/rhonchi. Gastrointestinal: Soft and nontender. No distention.  Genitourinary: Deferred Musculoskeletal: Normal range of motion in all extremities. No joint effusions.  No lower extremity tenderness nor edema. Neurologic:  Normal speech and language. No gross focal neurologic deficits are appreciated. Speech is normal.  Skin:  Skin is warm, dry and intact. No rash noted. Psychiatric: Mood and affect are normal. Speech and behavior are normal. Patient exhibits appropriate insight and judgment.  ____________________________________________    LABS (pertinent positives/negatives)  Labs Reviewed  BASIC METABOLIC PANEL - Abnormal; Notable for the following:    Sodium 129 (*)    Potassium 3.4 (*)    Chloride 92 (*)    Glucose, Bld 120 (*)    Creatinine, Ser 1.31 (*)    Calcium 8.5 (*)    GFR calc non Af Amer 36 (*)    GFR calc Af Amer 41 (*)     All other components within normal limits  CBC - Abnormal; Notable for the following:    RBC 3.41 (*)    Hemoglobin 9.7 (*)    HCT 29.8 (*)    RDW 16.4 (*)    All other  components within normal limits  URINALYSIS COMPLETEWITH MICROSCOPIC ([facility] ONLY) - Abnormal; Notable for the following:    Color, Urine YELLOW (*)    APPearance CLEAR (*)    Hgb urine dipstick 1+ (*)    Protein, ur >500 (*)    Squamous Epithelial / LPF 0-5 (*)    All other components within normal limits  GLUCOSE, CAPILLARY - Abnormal; Notable for the following:    Glucose-Capillary 121 (*)    All other components within normal limits  CBG MONITORING, ED     ____________________________________________   EKG  I, Nance Pear, attending physician, personally viewed and interpreted this EKG  EKG Time: 1735 Rate: 73 Rhythm: NSR Axis: normal Intervals: qtc 557 QRS: narrow, q waves III ST changes: no st elevation ____________________________________________    RADIOLOGY  None  ____________________________________________   PROCEDURES  Procedure(s) performed: None  Critical Care performed: No  ____________________________________________   INITIAL IMPRESSION / ASSESSMENT AND PLAN / ED COURSE  Pertinent labs & imaging results that were available during my care of the patient were reviewed by me and considered in my medical decision making (see chart for details).  Patient presented to the emergency department because of concerns for fatigue and dehydration. Patient has had nausea, vomiting and diarrhea for the past 4 days. Blood work notable for hyponatremia. This was not present 2 days ago. It is consistent with dehydration. Given level of hyponatremia will plan on admission to the hospital for further workup and management.  ____________________________________________   FINAL CLINICAL IMPRESSION(S) / ED DIAGNOSES  Final diagnoses:  Hyponatremia     Nance Pear,  MD 06/27/15 2102

## 2015-06-28 ENCOUNTER — Other Ambulatory Visit: Payer: Medicare Other

## 2015-06-28 ENCOUNTER — Ambulatory Visit: Payer: Medicare Other | Admitting: Oncology

## 2015-06-28 LAB — BASIC METABOLIC PANEL
ANION GAP: 6 (ref 5–15)
BUN: 16 mg/dL (ref 6–20)
CALCIUM: 8.5 mg/dL — AB (ref 8.9–10.3)
CO2: 31 mmol/L (ref 22–32)
CREATININE: 1.04 mg/dL — AB (ref 0.44–1.00)
Chloride: 100 mmol/L — ABNORMAL LOW (ref 101–111)
GFR, EST AFRICAN AMERICAN: 55 mL/min — AB (ref >60)
GFR, EST NON AFRICAN AMERICAN: 47 mL/min — AB (ref >60)
Glucose, Bld: 118 mg/dL — ABNORMAL HIGH (ref 65–99)
Potassium: 3.2 mmol/L — ABNORMAL LOW (ref 3.5–5.1)
SODIUM: 137 mmol/L (ref 135–145)

## 2015-06-28 MED ORDER — LABETALOL HCL 200 MG PO TABS
600.0000 mg | ORAL_TABLET | Freq: Two times a day (BID) | ORAL | Status: DC
  Administered 2015-06-28 – 2015-06-30 (×5): 600 mg via ORAL
  Filled 2015-06-28: qty 3
  Filled 2015-06-28: qty 2
  Filled 2015-06-28: qty 3
  Filled 2015-06-28: qty 2
  Filled 2015-06-28 (×2): qty 3

## 2015-06-28 MED ORDER — CLONIDINE HCL 0.1 MG PO TABS
0.2000 mg | ORAL_TABLET | Freq: Two times a day (BID) | ORAL | Status: DC
  Administered 2015-06-28 – 2015-06-30 (×4): 0.2 mg via ORAL
  Filled 2015-06-28 (×5): qty 2

## 2015-06-28 MED ORDER — HYDRALAZINE HCL 20 MG/ML IJ SOLN
10.0000 mg | Freq: Four times a day (QID) | INTRAMUSCULAR | Status: DC | PRN
  Administered 2015-06-28: 17:00:00 10 mg via INTRAVENOUS
  Filled 2015-06-28 (×2): qty 1

## 2015-06-28 MED ORDER — POTASSIUM CHLORIDE 20 MEQ PO PACK
20.0000 meq | PACK | Freq: Once | ORAL | Status: AC
  Administered 2015-06-28: 17:00:00 20 meq via ORAL
  Filled 2015-06-28: qty 1

## 2015-06-28 NOTE — Progress Notes (Signed)
Initial Nutrition Assessment     INTERVENTION:  Meals and snacks: Cater to pt preferences. Called kitchen for soup for lunch today Nutrition Supplement Therapy: will add mightyshake BID for added nutrition  NUTRITION DIAGNOSIS:   Inadequate oral intake related to poor appetite, cancer and cancer related treatments as evidenced by per patient/family report.    GOAL:   Patient will meet greater than or equal to 90% of their needs    MONITOR:    (Energy intake, Digestive system)  REASON FOR ASSESSMENT:   Malnutrition Screening Tool    ASSESSMENT:      Pt admitted with nausea, vomiting, diarrhea, hyponatremia, last chemo on 8/10  Past Medical History  Diagnosis Date  . Hypertension   . Reflux   . Osteoarthritis   . Hypercholesterolemia   . Cancer     Current Nutrition: poor po intake per dtr at bedside  Food/Nutrition-Related History: Dtr reports poor po intake for the past 5 days prior to admission, just eating few bites during this time frame   Medications: NS at 142ml/hr, colace, vit B12, Kdur  Electrolyte/Renal Profile and Glucose Profile:   Recent Labs Lab 06/25/15 1139 06/27/15 1728 06/28/15 0457  NA 139 129* 137  K 3.6 3.4* 3.2*  CL 97* 92* 100*  CO2 32 26 31  BUN 18 17 16   CREATININE 0.82 1.31* 1.04*  CALCIUM 9.4 8.5* 8.5*  MG 1.5*  --   --   GLUCOSE 179* 120* 118*   Protein Profile:  Recent Labs Lab 06/25/15 1139  ALBUMIN 3.8    Gastrointestinal Profile: Last BM: 8/17   Nutrition-Focused Physical Exam Findings:  Unable to complete Nutrition-Focused physical exam at this time. Pt getting ready to eat lunch    Weight Change: Noted 2 % weight loss in the last 3 months.     Diet Order:  DIET SOFT Room service appropriate?: Yes; Fluid consistency:: Thin  Skin:   reviewed  Height:   Ht Readings from Last 1 Encounters:  06/27/15 5\' 7"  (1.702 m)    Weight:   Wt Readings from Last 1 Encounters:  06/27/15 221 lb 3.2 oz  (100.336 kg)     BMI:  Body mass index is 34.64 kg/(m^2).  Estimated Nutritional Needs:   Kcal:  Using IBW of 61kg BEE 1082 kcals (IF 1.0-1.2, AF 1.3) 6295-2841 kcals/d  Protein:  Using IBW of 61kg (1.0-1.2 g/kg) 61-73 g/d  Fluid:  Using IBW of 61kg (30-3ml/kg) 1830-21101ml/d  EDUCATION NEEDS:   No education needs identified at this time  MODERATE Care Level  Mirtie Bastyr B. Zenia Resides, [place], [place] (pager)

## 2015-06-28 NOTE — Care Management (Signed)
Admitted to [facility] with the diagnosis of hyponatremia. Lives alone. Daughter is [name] [phone]).  Last seen Dr. Ouida Sills 6 months ago. No home health. No skilled facility. Pays someone to come in and do her house work out of pocket. Uses a cane as needed to aid in ambulation. Takes care of all her activities of daily living herself, still drives. Poor appetite since her husband died 6 months ago. No falls. Daughter will transport home.  Sodium level this morning = [place] [facility] [phone]

## 2015-06-28 NOTE — Evaluation (Signed)
Physical Therapy Evaluation Patient Details Name: Barbara Chavez MRN: [id number] DOB: 1927/06/14 Today's Date: 06/28/2015   History of Present Illness  Pt admitted to hospital with general weakness, nausea/vomitting, and diarrhea, diagnosed with hyponatremia secondary to dehydration.    Clinical Impression  Pt is hard of hearing but hears better on the L side.  Pt was able to complete bed mobility with supervision and demonstrates good standing balance as seen with her washing her hands with no UE support/assistive device (min assist for safety purposes).  Pt was able to ambulate for 15 ft with RW/min assist and 5 ft with no device/min assist (20 ft total) and is incapable of ambulating for community/household distances independently. Pt has most difficulty with standing transfers from multiple surface levels, pt requires mod assist using a RW, this is due to her inability to flex knees to 90 deg.  Pt would benefit from skilled PT in order to increase gross strength, improve with transfers and increase ambulation distance with least restrictive device.     Follow Up Recommendations SNF    Equipment Recommendations  Rolling walker with 5" wheels    Recommendations for Other Services       Precautions / Restrictions Precautions Precautions: Fall Restrictions Weight Bearing Restrictions: No      Mobility  Bed Mobility Overal bed mobility: Needs Assistance Bed Mobility: Supine to Sit     Supine to sit: Supervision     General bed mobility comments: Pt was able to transfer to the L side of the EOB with supervision, pt reported it took her longer than usual today and demonstrated heavy UE bedrail compensation.   Transfers Overall transfer level: Needs assistance Equipment used: Rolling walker (2 wheeled) Transfers: Sit to/from Stand Sit to Stand: Mod assist         General transfer comment: Pt is unable to reach 90 deg knee flex on R/L side and this inhibits her  standing transfer secondary to less glut/quad activation, requires mod assist.    Ambulation/Gait Ambulation/Gait assistance: Min assist Ambulation Distance (Feet): 20 Feet Assistive device: Rolling walker (2 wheeled);None       General Gait Details: Pt demonstrates shuffling gait pattern/excessive trunk flex with min assist from RW.  Pt was able to walk from toilet to seat (~3 ft) with min assist from no assistive device and without any LOBs.    Stairs            Wheelchair Mobility    Modified Rankin (Stroke Patients Only)       Balance Overall balance assessment: Needs assistance Sitting-balance support: Bilateral upper extremity supported;Single extremity supported;No upper extremity supported Sitting balance-Leahy Scale: Good Sitting balance - Comments: Pt was able to complete hip marches/LAQs at the EOB (unsupported) with supervision.    Standing balance support: Single extremity supported;Bilateral upper extremity supported;No upper extremity supported Standing balance-Leahy Scale: Good Standing balance comment: Pt was able to wash hands demonstrating no UE support with standing (min assist for safety purposes).                             Pertinent Vitals/Pain Pain Assessment: No/denies pain    Home Living Family/patient expects to be discharged to:: Private residence Living Arrangements: Children (Daughter lives with her, she works full-time) Available Help at Discharge: Family (Daughter could help but she works full time) Type of Home: [place]: One level Home Equipment: Radio producer -  single point Additional Comments: Pt uses cane for long distances but reports not using any devices when she's at home secondary to disliking it aesthetically.     Prior Function Level of Independence: Independent               Hand Dominance        Extremity/Trunk Assessment   Upper Extremity Assessment: Overall WFL for tasks assessed (Pt  was unable to reach full ROM with shoulder flexion, pain limiting.  Pt demonstrated 5/5 grip strength. )           Lower Extremity Assessment: Overall WFL for tasks assessed (Pt demonstrates at least 4/5 streangth with bilat LE with exception to bilat 3/5 hip flex.)         Communication   Communication: Deaf (Dan't hear well, hears better on L side)  Cognition Arousal/Alertness: Awake/alert Behavior During Therapy: WFL for tasks assessed/performed Overall Cognitive Status: Within Functional Limits for tasks assessed                      General Comments      Exercises Other Exercises Other Exercises: Seated at EOB bilat LAQs/hip marches, 1 x 15 (10 min)       Assessment/Plan    PT Assessment Patient needs continued PT services  PT Diagnosis Difficulty walking;Abnormality of gait;Generalized weakness   PT Problem List Decreased strength;Decreased range of motion;Decreased activity tolerance;Decreased balance;Decreased mobility;Decreased coordination;Decreased knowledge of use of DME;Decreased knowledge of precautions  PT Treatment Interventions DME instruction;Gait training;Functional mobility training;Therapeutic activities;Therapeutic exercise;Balance training   PT Goals (Current goals can be found in the Care Plan section) Acute Rehab PT Goals Patient Stated Goal: to return home Time For Goal Achievement: 07/12/15 Potential to Achieve Goals: Good    Frequency Min 2X/week   Barriers to discharge Inaccessible home environment      Co-evaluation               End of Session Equipment Utilized During Treatment: Gait belt Activity Tolerance: Patient tolerated treatment well Patient left: in chair;with call bell/phone within reach;with chair alarm set;with nursing/sitter in room Nurse Communication: Mobility status         Time: 7209-4709 PT Time Calculation (min) (ACUTE ONLY): 35 min   Charges:         PT G CodesBernestine Amass,  SPT 06/28/2015 10:26 AM

## 2015-06-28 NOTE — Plan of Care (Signed)
Problem: Discharge Progression Outcomes Goal: Discharge plan in place and appropriate Individualization of care Likes to be called Barbara Chavez. Lives at home alone. Uses a straight cane at home. On high falls precautions per policy, offer toileting during hourly rounds. Has history of Non-Hodgkin's Lymphoma, last chemo August 10th. Has history of hypertension, reflux and osteoarthritis, controlled by medications.      Goal: Other Discharge Outcomes/Goals Plan of care progress to goal: - Continues IV fluids. - No complaints of pain. - Assist to [facility]. - Will continue to monitor.

## 2015-06-28 NOTE — Plan of Care (Signed)
Problem: Discharge Progression Outcomes Goal: Pain controlled with appropriate interventions Outcome: Progressing No c/o pain Goal: Hemodynamically stable Outcome: Progressing Cont with weakness but able to get oob with assist to chair and bsc. Goal: Complications resolved/controlled Outcome: Progressing Na better today 137 Goal: Tolerating diet Outcome: Progressing Eating fair Goal: Activity appropriate for discharge plan Outcome: Progressing See above note

## 2015-06-28 NOTE — Progress Notes (Signed)
[place] [facility] [place] NAME: Barbara Chavez    MR#:  [id number]  DATE OF BIRTH:  11/18/1926  SUBJECTIVE:  Feels weak. Ate some today  REVIEW OF SYSTEMS:   Review of Systems  Constitutional: Negative for fever, chills and weight loss.  HENT: Negative for ear discharge, ear pain and nosebleeds.   Eyes: Negative for blurred vision, pain and discharge.  Respiratory: Negative for sputum production, shortness of breath, wheezing and stridor.   Cardiovascular: Negative for chest pain, palpitations, orthopnea and PND.  Gastrointestinal: Negative for nausea, vomiting, abdominal pain and diarrhea.  Genitourinary: Negative for urgency and frequency.  Musculoskeletal: Negative for back pain and joint pain.  Neurological: Positive for weakness. Negative for sensory change, speech change and focal weakness.  Psychiatric/Behavioral: Negative for depression and hallucinations. The patient is not nervous/anxious.    Tolerating Diet:yes Tolerating PT: rec SNF  DRUG ALLERGIES:  No Known Allergies  VITALS:  Blood pressure 216/74, pulse 79, temperature 99 F (37.2 C), temperature source Oral, resp. rate 18, height 5\' 7"  (1.702 m), weight 100.336 kg (221 lb 3.2 oz), SpO2 100 %.  PHYSICAL EXAMINATION:   Physical Exam  GENERAL:  81 y.o.-year-old patient lying in the bed with no acute distress.  EYES: Pupils equal, round, reactive to light and accommodation. No scleral icterus. Extraocular muscles intact.  HEENT: Head atraumatic, normocephalic. Oropharynx and nasopharynx clear.  NECK:  Supple, no jugular venous distention. No thyroid enlargement, no tenderness.  LUNGS: Normal breath sounds bilaterally, no wheezing, rales, rhonchi. No use of accessory muscles of respiration.  CARDIOVASCULAR: S1, S2 normal. No murmurs, rubs, or gallops.  ABDOMEN: Soft, nontender, nondistended. Bowel sounds present. No organomegaly or mass.  EXTREMITIES: No cyanosis,  clubbing or edema b/l.    NEUROLOGIC: Cranial nerves II through XII are intact. No focal Motor or sensory deficits b/l.  Subjective weakness + PSYCHIATRIC: The patient is alert and oriented x 2  SKIN: No obvious rash, lesion, or ulcer.    LABORATORY PANEL:   CBC  Recent Labs Lab 06/27/15 1728  WBC 9.5  HGB 9.7*  HCT 29.8*  PLT 264    Chemistries   Recent Labs Lab 06/25/15 1139  06/28/15 0457  NA 139  < > 137  K 3.6  < > 3.2*  CL 97*  < > 100*  CO2 32  < > 31  GLUCOSE 179*  < > 118*  BUN 18  < > 16  CREATININE 0.82  < > 1.04*  CALCIUM 9.4  < > 8.5*  MG 1.5*  --   --   AST 26  --   --   ALT 20  --   --   ALKPHOS 138*  --   --   BILITOT 0.5  --   --   < > = values in this interval not displayed.  Cardiac Enzymes No results for input(s): TROPONINI in the last 168 hours.  RADIOLOGY:  No results found.   ASSESSMENT AND PLAN:   81 year old [name] with past medical history of non-Hodgkin's lymphoma undergoing chemotherapy at the cancer center, osteoarthritis comes to the emergency room with nausea vomiting and diarrhea for last several days. She was found to have  1. Acute hyponatremia secondary to dehydration from nausea vomiting. IV fluids with normal saline. I's and O's  -sodium back to normal. D/c IVF  2. Acute renal failure/dehydration in the setting of nausea vomiting Treatment as above.  3 generalized weakness  fatigue with fall at home per daughter Physical therapy recommends rehab  4. Non-Hodgkin's lymphoma per oncology as outpatient.  5. Relative hypotension resolved. Bp on the higher side Resume Clonidine,labetalol and prn hydralalzine  6 DVT prophylaxis subcutaneous Lovenox   CSW for d/c planning  Case discussed with Care Management/Social Worker. Management plans discussed with the patient, family and they are in agreement.  CODE STATUS: full  TOTAL TIME TAKING CARE OF THIS PATIENT: 40 minutes.  >50% time spent on counselling and  coordination of care. D/w CSW and pt  POSSIBLE D/C IN 1-2DAYS, DEPENDING ON CLINICAL CONDITION.   Anora Schwenke M.D on 06/28/2015 at 2:34 PM  Between 7am to 6pm - Pager - [phone]  After 6pm go to [email or web address] - password [email or web address] [facility]  Office  [phone]  CC: Primary care physician; Kirk Ruths., MD

## 2015-06-28 NOTE — Progress Notes (Signed)
Dr patel on floor and spoke with her  About pts  Elevated b/p. Order given for this.

## 2015-06-29 ENCOUNTER — Ambulatory Visit: Payer: Medicare Other | Admitting: Hematology and Oncology

## 2015-06-29 ENCOUNTER — Other Ambulatory Visit: Payer: Medicare Other

## 2015-06-29 NOTE — Progress Notes (Signed)
   06/29/15 2024  Vitals  BP (!) 122/42 mmHg  BP Method Manual   Spoke with Dr. Posey Pronto about pt's current BP and BP meds due at this time.  MD gave verbal order to hold Clonidine at this time.  Clarise Cruz, RN

## 2015-06-29 NOTE — Clinical Social Work Placement (Signed)
   CLINICAL SOCIAL WORK PLACEMENT  NOTE  Date:  06/29/2015  Patient Details  Name: Barbara Chavez MRN: [id number] Date of Birth: Jul 20, 1927  Clinical Social Work is seeking post-discharge placement for this patient at the [place] level of care (*CSW will initial, date and re-position this form in  chart as items are completed):  Yes   Patient/family provided with [place] Work Department's list of facilities offering this level of care within the geographic area requested by the patient (or if unable, by the patient's family).  Yes   Patient/family informed of their freedom to choose among providers that offer the needed level of care, that participate in Medicare, Medicaid or managed care program needed by the patient, have an available bed and are willing to accept the patient.  Yes   Patient/family informed of [facility]'s ownership interest in [facility] and [facility], as well as of the fact that they are under no obligation to receive care at these facilities.  PASRR submitted to EDS on       PASRR number received on       Existing PASRR number confirmed on 06/29/15     FL2 transmitted to all facilities in geographic area requested by pt/family on 06/29/15     FL2 transmitted to all facilities within larger geographic area on       Patient informed that his/her managed care company has contracts with or will negotiate with certain facilities, including the following:        Yes   Patient/family informed of bed offers received.  Patient chooses bed at       Physician recommends and patient chooses bed at      Patient to be transferred to   on  .  Patient to be transferred to facility by       Patient family notified on   of transfer.  Name of family member notified:        PHYSICIAN Please sign FL2, Please prepare prescriptions     Additional Comment:    _______________________________________________ Alonna Buckler, LCSW 06/29/2015, 10:05 PM

## 2015-06-29 NOTE — Clinical Social Work Placement (Signed)
   CLINICAL SOCIAL WORK PLACEMENT  NOTE  Date:  06/29/2015  Patient Details  Name: Barbara Chavez MRN: [id number] Date of Birth: 04-19-1927  Clinical Social Work is seeking post-discharge placement for this patient at the [place] level of care (*CSW will initial, date and re-position this form in  chart as items are completed):  Yes   Patient/family provided with [place] Work Department's list of facilities offering this level of care within the geographic area requested by the patient (or if unable, by the patient's family).  Yes   Patient/family informed of their freedom to choose among providers that offer the needed level of care, that participate in Medicare, Medicaid or managed care program needed by the patient, have an available bed and are willing to accept the patient.  Yes   Patient/family informed of [place]'s ownership interest in [facility] and [facility], as well as of the fact that they are under no obligation to receive care at these facilities.  PASRR submitted to EDS on       PASRR number received on       Existing PASRR number confirmed on 06/29/15     FL2 transmitted to all facilities in geographic area requested by pt/family on 06/29/15     FL2 transmitted to all facilities within larger geographic area on       Patient informed that his/her managed care company has contracts with or will negotiate with certain facilities, including the following:        Yes   Patient/family informed of bed offers received.  Patient chooses bed at       Physician recommends and patient chooses bed at      Patient to be transferred to   on  .  Patient to be transferred to facility by       Patient family notified on   of transfer.  Name of family member notified:        PHYSICIAN Please sign FL2, Please prepare prescriptions     Additional Comment:    _______________________________________________ Alonna Buckler, LCSW 06/29/2015, 10:06 PM

## 2015-06-29 NOTE — Plan of Care (Signed)
Problem: Discharge Progression Outcomes Goal: Other Discharge Outcomes/Goals Outcome: Progressing Patient is alert to self, confused. No complaints of pain at this time. Blood pressure has improved with prescribed medication. Resting quietly.

## 2015-06-29 NOTE — Progress Notes (Signed)
[facility]  [phone] [phone]  ID: Barbara Chavez OB: 02/13/1927  MR#: [id number]  VOH#:[id number]  Patient Care Team: Kirk Ruths, MD as PCP - General (Internal Medicine)  CHIEF COMPLAINT:  Chief Complaint  Patient presents with  . Follow-up    nausea, weakness    INTERVAL HISTORY: Patient returns to clinic today for further evaluation for her persistent nausea. She continues to feel weakness and fatigue, but admits this has slightly improved. She recently completed treatment for low-grade follicular lymphoma with weekly Rituxan 4. She denies any fevers. She has no neurologic complaints. She denies any chest pain or shortness of breath. She has a poor appetite. Patient feels generally terrible, but offers no further specific complaints today.  REVIEW OF SYSTEMS:   Review of Systems  Constitutional: Positive for malaise/fatigue. Negative for fever and weight loss.  Respiratory: Negative.   Cardiovascular: Negative.   Gastrointestinal: Positive for nausea. Negative for vomiting.  Genitourinary: Negative.   Musculoskeletal: Negative.   Neurological: Positive for weakness.    As per HPI. Otherwise, a complete review of systems is negatve.  PAST MEDICAL HISTORY: Past Medical History  Diagnosis Date  . Hypertension   . Reflux   . Osteoarthritis   . Hypercholesterolemia   . Cancer     PAST SURGICAL HISTORY: Past Surgical History  Procedure Laterality Date  . Cataract extraction    . Knee arthroscopy      FAMILY HISTORY Family History  Problem Relation Age of Onset  . Cancer Sister   . Breast cancer Sister        ADVANCED DIRECTIVES:    HEALTH MAINTENANCE: Social History  Substance Use Topics  . Smoking status: Never Smoker   . Smokeless tobacco: Never Used  . Alcohol Use: No     Colonoscopy:  PAP:  Bone density:  Lipid panel:  No Known Allergies  Current Facility-Administered Medications    Medication Dose Route Frequency Provider Last Rate Last Dose  . 0.9 %  sodium chloride infusion   Intravenous Once Lloyd Huger, MD      . ondansetron [facility]) 8 mg, dexamethasone (DECADRON) 10 mg in sodium chloride 0.9 % 50 mL IVPB   Intravenous Once Lloyd Huger, MD       No current outpatient prescriptions on file.   Facility-Administered Medications Ordered in Other Visits  Medication Dose Route Frequency Provider Last Rate Last Dose  . acetaminophen (TYLENOL) tablet 650 mg  650 mg Oral Q6H PRN Fritzi Mandes, MD       Or  . acetaminophen (TYLENOL) suppository 650 mg  650 mg Rectal Q6H PRN Fritzi Mandes, MD      . allopurinol (ZYLOPRIM) tablet 300 mg  300 mg Oral Daily Fritzi Mandes, MD   300 mg at 06/29/15 0911  . aspirin EC tablet 81 mg  81 mg Oral Daily Fritzi Mandes, MD   81 mg at 06/29/15 0910  . atorvastatin (LIPITOR) tablet 40 mg  40 mg Oral QHS Fritzi Mandes, MD   40 mg at 06/28/15 2131  . budesonide (PULMICORT) nebulizer solution 0.25 mg  0.25 mg Nebulization BID PRN Fritzi Mandes, MD      . busPIRone (BUSPAR) tablet 30 mg  30 mg Oral BID Fritzi Mandes, MD   30 mg at 06/29/15 0910  . citalopram (CELEXA) tablet 20 mg  20 mg Oral Daily Fritzi Mandes, MD   20 mg at 06/29/15 0913  . cloNIDine (CATAPRES) tablet 0.2 mg  0.2  mg Oral BID Fritzi Mandes, MD   0.2 mg at 06/29/15 0910  . docusate sodium (COLACE) capsule 100 mg  100 mg Oral Cherylynn Ridges, MD   100 mg at 06/28/15 0952  . donepezil (ARICEPT) tablet 10 mg  10 mg Oral QHS Fritzi Mandes, MD   10 mg at 06/28/15 2131  . enoxaparin (LOVENOX) injection 40 mg  40 mg Subcutaneous Q24H Fritzi Mandes, MD   40 mg at 06/28/15 2131  . hydrALAZINE (APRESOLINE) injection 10 mg  10 mg Intravenous Q6H PRN Fritzi Mandes, MD   10 mg at 06/28/15 1719  . labetalol (NORMODYNE) tablet 600 mg  600 mg Oral BID Fritzi Mandes, MD   600 mg at 06/29/15 0911  . loratadine (CLARITIN) tablet 10 mg  10 mg Oral Daily Fritzi Mandes, MD   10 mg at 06/29/15 0913  . ondansetron (ZOFRAN)  tablet 4 mg  4 mg Oral Q6H PRN Fritzi Mandes, MD       Or  . ondansetron (ZOFRAN) injection 4 mg  4 mg Intravenous Q6H PRN Fritzi Mandes, MD      . potassium chloride SA (K-DUR,KLOR-CON) CR tablet 20 mEq  20 mEq Oral Daily Fritzi Mandes, MD   20 mEq at 06/28/15 2131  . prochlorperazine (COMPAZINE) tablet 10 mg  10 mg Oral Q6H PRN Fritzi Mandes, MD      . traMADol Veatrice Bourbon) tablet 50-100 mg  50-100 mg Oral Q8H PRN Fritzi Mandes, MD      . vitamin B-12 (CYANOCOBALAMIN) tablet 1,000 mcg  1,000 mcg Oral Daily Fritzi Mandes, MD   1,000 mcg at 06/29/15 0911    OBJECTIVE: Filed Vitals:   06/26/15 1135  BP: 180/76  Pulse: 73  Temp: 97.6 F (36.4 C)  Resp: 18     There is no weight on file to calculate BMI.    ECOG FS:1 - Symptomatic but completely ambulatory  General: Well-developed, well-nourished, no acute distress. Eyes: Pink conjunctiva, anicteric sclera. Lungs: Clear to auscultation bilaterally. Heart: Regular rate and rhythm. No rubs, murmurs, or gallops. Abdomen: Soft, nontender, nondistended. No organomegaly noted, normoactive bowel sounds. Musculoskeletal: No edema, cyanosis, or clubbing. Neuro: Alert, answering all questions appropriately. Cranial nerves grossly intact. Skin: No rashes or petechiae noted. Psych: Normal affect. Lymphatics: No cervical, calvicular, axillary or inguinal LAD.   LAB RESULTS:  Lab Results  Component Value Date   NA 137 06/28/2015   K 3.2* 06/28/2015   CL 100* 06/28/2015   CO2 31 06/28/2015   GLUCOSE 118* 06/28/2015   BUN 16 06/28/2015   CREATININE 1.04* 06/28/2015   CALCIUM 8.5* 06/28/2015   PROT 8.1 06/25/2015   ALBUMIN 3.8 06/25/2015   AST 26 06/25/2015   ALT 20 06/25/2015   ALKPHOS 138* 06/25/2015   BILITOT 0.5 06/25/2015   GFRNONAA 47* 06/28/2015   GFRAA 55* 06/28/2015    Lab Results  Component Value Date   WBC 9.5 06/27/2015   NEUTROABS 5.7 06/25/2015   HGB 9.7* 06/27/2015   HCT 29.8* 06/27/2015   MCV 87.5 06/27/2015   PLT 264 06/27/2015      STUDIES: No results found.  ASSESSMENT: Grade 1, at least stage III follicular lymphoma.  PLAN:    1. Follicular lymphoma: PET scan in June 2016 revealed diffuse hypermetabolic lymphadenopathy. Biopsy confirmed follicular lymphoma. No bone marrow biopsy was performed.  Patient recently completed weekly Rituxan 4 on June 20, 2015.  Will repeat imaging and approximately 3 months to assess for interval change. 2. Nausea: Likely  gastroenteritis. Patient will receive an additional 2 L of fluids today. Return to clinic in 1 month with repeat laboratory work and further evaluation. We will schedule imaging at next clinic visit. 3. Anemia: Mild, previously iron stores were within normal limits.  Patient expressed understanding and was in agreement with this plan. She also understands that She can call clinic at any time with any questions, concerns, or complaints.   Lloyd Huger, MD   06/29/2015 12:00 PM

## 2015-06-29 NOTE — Care Management Important Message (Signed)
Important Message  Patient Details  Name: Barbara Chavez MRN: [id number] Date of Birth: 1927/09/16   Medicare Important Message Given:  Yes-second notification given    Juliann Pulse A Allmond 06/29/2015, 10:12 AM

## 2015-06-29 NOTE — Progress Notes (Signed)
Barbara Chavez NAME: Barbara Chavez    MR#:  [id number]  DATE OF BIRTH:  [birth year]  SUBJECTIVE:  Feels weak. Ate some today Much better spirits today. dter in the room REVIEW OF SYSTEMS:   Review of Systems  Constitutional: Negative for fever, chills and weight loss.  HENT: Negative for ear discharge, ear pain and nosebleeds.   Eyes: Negative for blurred vision, pain and discharge.  Respiratory: Negative for sputum production, shortness of breath, wheezing and stridor.   Cardiovascular: Negative for chest pain, palpitations, orthopnea and PND.  Gastrointestinal: Negative for nausea, vomiting, abdominal pain and diarrhea.  Genitourinary: Negative for urgency and frequency.  Musculoskeletal: Negative for back pain and joint pain.  Neurological: Positive for weakness. Negative for sensory change, speech change and focal weakness.  Psychiatric/Behavioral: Negative for depression and hallucinations. The patient is not nervous/anxious.    Tolerating Diet:yes Tolerating PT: rec SNF  DRUG ALLERGIES:  No Known Allergies  VITALS:  Blood pressure 132/89, pulse 64, temperature 98.2 F (36.8 C), temperature source Oral, resp. rate 18, height 5\' 7"  (1.702 m), weight 100.336 kg (221 lb 3.2 oz), SpO2 99 %.  PHYSICAL EXAMINATION:   Physical Exam  GENERAL:  81 y.o.-year-old patient lying in the bed with no acute distress.  EYES: Pupils equal, round, reactive to light and accommodation. No scleral icterus. Extraocular muscles intact.  HEENT: Head atraumatic, normocephalic. Oropharynx and nasopharynx clear.  NECK:  Supple, no jugular venous distention. No thyroid enlargement, no tenderness.  LUNGS: Normal breath sounds bilaterally, no wheezing, rales, rhonchi. No use of accessory muscles of respiration.  CARDIOVASCULAR: S1, S2 normal. No murmurs, rubs, or gallops.  ABDOMEN: Soft, nontender, nondistended. Bowel sounds present. No  organomegaly or mass.  EXTREMITIES: No cyanosis, clubbing or edema b/l.    NEUROLOGIC: Cranial nerves II through XII are intact. No focal Motor or sensory deficits b/l.  Subjective weakness + PSYCHIATRIC: The patient is alert and oriented x 2  SKIN: No obvious rash, lesion, or ulcer.    LABORATORY PANEL:   CBC  Recent Labs Lab 06/27/15 1728  WBC 9.5  HGB 9.7*  HCT 29.8*  PLT 264    Chemistries   Recent Labs Lab 06/25/15 1139  06/28/15 0457  NA 139  < > 137  K 3.6  < > 3.2*  CL 97*  < > 100*  CO2 32  < > 31  GLUCOSE 179*  < > 118*  BUN 18  < > 16  CREATININE 0.82  < > 1.04*  CALCIUM 9.4  < > 8.5*  MG 1.5*  --   --   AST 26  --   --   ALT 20  --   --   ALKPHOS 138*  --   --   BILITOT 0.5  --   --   < > = values in this interval not displayed.  Cardiac Enzymes No results for input(s): TROPONINI in the last 168 hours.  RADIOLOGY:  No results found.   ASSESSMENT AND PLAN:   81 year old [name] with past medical history of non-Hodgkin's lymphoma undergoing chemotherapy at the cancer center, osteoarthritis comes to the emergency room with nausea vomiting and diarrhea for last several days. She was found to have  1. Acute hyponatremia secondary to dehydration from nausea vomiting. Received IV fluids with normal saline.  -sodium back to normal. D/c IVF  2. Acute renal failure/dehydration in the setting of nausea vomiting Treatment as  above.  3 generalized weakness fatigue with fall at home per daughter Physical therapy recommends rehab  4. Non-Hodgkin's lymphoma per oncology as outpatient.  5. Relative hypotension resolved. Bp on the higher side Resume Clonidine,labetalol and prn hydralalzine  6 DVT prophylaxis subcutaneous Lovenox   CSW for d/c planning  Case discussed with Care Management/Social Worker. Management plans discussed with the patient, family and they are in agreement.  CODE STATUS: full  TOTAL TIME TAKING CARE OF THIS PATIENT: 40  minutes.  >50% time spent on counselling and coordination of care. D/w CSW, pt and dter  POSSIBLE D/C IN 1-2DAYS, DEPENDING ON CLINICAL CONDITION.   Jacai Kipp M.D on 06/29/2015 at 9:23 AM  Between 7am to 6pm - Pager - [phone]  After 6pm go to [email or web address] - password [email or web address] [facility]  Office  [phone]  CC: Primary care physician; Kirk Ruths., MD

## 2015-06-29 NOTE — Progress Notes (Signed)
   06/29/15 0940  Clinical Encounter Type  Visited With Patient  Visit Type Initial  Provided pastoral presence and support to patient on unit.  Patient said she was doing better today than yesterday.  [place] [phone]

## 2015-06-29 NOTE — Progress Notes (Signed)
Physical Therapy Treatment Patient Details Name: Barbara Chavez MRN: [id number] DOB: 04-17-1927 Today's Date: 06/29/2015    History of Present Illness Pt admitted to hospital with general weakness, nausea/vomitting, and diarrhea, diagnosed with hyponatremia secondary to dehydration.    PT Comments    Pt was able to complete bed mobility with supervision and continues to have most difficulty with standing transfers from multiple surface levels, pt requires mod assist using a RW, this is due to her inability to flex knees to 90 deg, requires PT to block her feet. On pt's first attempt of standing with RW, pt demonstrated posterior lean and had to sit back down, was corrected with verbal cuing and was able to stand on second attempt. Pt was able to ambulate for 40 ft with RW/min assist.  Pt would benefit from skilled PT in order to increase gross strength, improve with transfers and increase ambulation distance with least restrictive device.    Follow Up Recommendations  SNF     Equipment Recommendations  Rolling walker with 5" wheels    Recommendations for Other Services       Precautions / Restrictions Precautions Precautions: Fall Restrictions Weight Bearing Restrictions: No    Mobility  Bed Mobility Overal bed mobility: Needs Assistance Bed Mobility: Supine to Sit     Supine to sit: Supervision     General bed mobility comments: Pt was able to transfer to the L side of the EOB with supervision, it took her longer than usual compared to her prior level LOF and demonstrated heavy UE bedrail compensation.   Transfers Overall transfer level: Needs assistance Equipment used: Rolling walker (2 wheeled) Transfers: Sit to/from Stand Sit to Stand: Mod assist         General transfer comment: Pt is unable to reach 90 deg knee flex on R/L side and this inhibits her standing transfer secondary to less glut/quad activation, requires mod assist and PT to block her feet to  prevent excessive knee ext/knee abd.    Ambulation/Gait Ambulation/Gait assistance: Min assist Ambulation Distance (Feet): 40 Feet Assistive device: Rolling walker (2 wheeled)     Gait velocity interpretation: <1.8 ft/sec, indicative of risk for recurrent falls General Gait Details: Pt demonstrates shuffling gait pattern/excessive trunk flex with min assist from RW, verbal cuing require to increase step length.  Pt also demonstrates excessive trunk flex.   Stairs            Wheelchair Mobility    Modified Rankin (Stroke Patients Only)       Balance Overall balance assessment: Needs assistance Sitting-balance support: No upper extremity supported;Single extremity supported Sitting balance-Leahy Scale: Good Sitting balance - Comments: Pt was able to complete hip marches/LAQs at the EOB (unsupported) with supervision.    Standing balance support: Bilateral upper extremity supported Standing balance-Leahy Scale: Good Standing balance comment: On pt's first attempt of standing with RW, pt demonstrated posterior lean and had to sit back down.  Verbal cuing was given to shift her weight over her BOS and pt was able to stand with no difficulty using a RW.                     Cognition Arousal/Alertness: Awake/alert Behavior During Therapy: WFL for tasks assessed/performed Overall Cognitive Status: Within Functional Limits for tasks assessed                      Exercises Other Exercises Other Exercises: Supine bilat SLR/heel slides, 1 x  10.  Seated at EOB bilat LAQs/hip marches/isometric hip abd, 1 x 10.       General Comments        Pertinent Vitals/Pain Pain Assessment: No/denies pain    Home Living                      Prior Function            PT Goals (current goals can now be found in the care plan section) Acute Rehab PT Goals Patient Stated Goal: to return home Time For Goal Achievement: 07/12/15 Potential to Achieve Goals:  Good Progress towards PT goals: Progressing toward goals    Frequency  Min 2X/week    PT Plan      Co-evaluation             End of Session Equipment Utilized During Treatment: Gait belt Activity Tolerance: Patient tolerated treatment well Patient left: with nursing/sitter in room;in bed;with bed alarm set;with call bell/phone within reach     Time: 0962-8366 PT Time Calculation (min) (ACUTE ONLY): 31 min  Charges:                       G CodesBernestine Amass, SPT 07-20-2015 3:48 PM

## 2015-06-29 NOTE — Clinical Social Work Note (Signed)
Clinical Social Work Assessment  Patient Details  Name: Barbara Chavez MRN: [id number] Date of Birth: Jan 08, 1927  Date of referral:  06/28/15               Reason for consult:  Facility Placement                Permission sought to share information with:  Facility Sport and exercise psychologist, Family Supports, PCP Permission granted to share information::  Yes, Verbal Permission Granted  Name::     Risk analyst  Agency::  [facility]  Relationship::     Contact Information:     Housing/Transportation Living arrangements for the past 2 months:  [place] of Information:  Patient, Medical Team, Adult Children, Case Manager Patient Interpreter Needed:  None Criminal Activity/Legal Involvement Pertinent to Current Situation/Hospitalization:  No - Comment as needed Significant Relationships:  Adult Children Lives with:  Adult Children Do you feel safe going back to the place where you live?  Yes Need for family participation in patient care:  Yes (Comment)  Care giving concerns:  Pt is widowed, lives with her daughter who works during the day.   Social Worker assessment / plan:  CSW met with Pt alone in her room to discuss dc planning. Pt is widowed, lives with her daughter who works during the day. Pt's daughter is supportive and very involved in patient's care and treatment planning. CSW and Pt discussed recommendations of short-term rehab at SNF before returning home. Pt reports that she has gone to SNF for rehab once before many years ago.  Pt is agreeable to SNF at dc. CSW will begin SNF bed search in anticipation for dc on Saturday.   Employment status:  Retired Forensic scientist:    PT Recommendations:  [place] / Referral to community resources:  [place]  Patient/Family's Response to care:  Pt shared that she is having a hard time with the constant interruptions of hospital operations, she reports  "don't people know that I don't feel well and don't want to talk."   Patient/Family's Understanding of and Emotional Response to Diagnosis, Current Treatment, and Prognosis: Pt shared that she is no longer in chemo but feels that she is recovering from the treatment now. She is hopeful to recover her strength and return home after SNF.  Emotional Assessment Appearance:  Appears younger than stated age Attitude/Demeanor/Rapport:   (cooperative) Affect (typically observed):  Anxious, Appropriate Orientation:  Oriented to Situation, Oriented to  Time, Oriented to Place, Oriented to Self Alcohol / Substance use:  Never Used Psych involvement (Current and /or in the community):  No (Comment)  Discharge Needs  Concerns to be addressed:  Adjustment to Illness, Discharge Planning Concerns Readmission within the last 30 days:  No Current discharge risk:  None Barriers to Discharge:  Barriers Resolved   Alonna Buckler, LCSW 06/29/2015, 9:45 PM

## 2015-06-30 MED ORDER — SODIUM CHLORIDE 0.9 % IJ SOLN
3.0000 mL | Freq: Two times a day (BID) | INTRAMUSCULAR | Status: DC
  Administered 2015-06-30: 3 mL via INTRAVENOUS

## 2015-06-30 NOTE — Clinical Social Work Placement (Signed)
   CLINICAL SOCIAL WORK PLACEMENT  NOTE  Date:  06/30/2015  Patient Details  Name: Barbara Chavez MRN: [id number] Date of Birth: 12-Oct-1927  Clinical Social Work is seeking post-discharge placement for this patient at the [place] level of care (*CSW will initial, date and re-position this form in  chart as items are completed):  Yes   Patient/family provided with [place] Work Department's list of facilities offering this level of care within the geographic area requested by the patient (or if unable, by the patient's family).  Yes   Patient/family informed of their freedom to choose among providers that offer the needed level of care, that participate in Medicare, Medicaid or managed care program needed by the patient, have an available bed and are willing to accept the patient.  Yes   Patient/family informed of [facility]'s ownership interest in [facility] and [facility], as well as of the fact that they are under no obligation to receive care at these facilities.  PASRR submitted to EDS on       PASRR number received on       Existing PASRR number confirmed on 06/29/15     FL2 transmitted to all facilities in geographic area requested by pt/family on 06/29/15     FL2 transmitted to all facilities within larger geographic area on       Patient informed that his/her managed care company has contracts with or will negotiate with certain facilities, including the following:        Yes   Patient/family informed of bed offers received.  Patient chooses bed at  [facility])     Physician recommends and patient chooses bed at  [facility])    Patient to be transferred to  [facility]) on  .  Patient to be transferred to facility by  (patient's daughter)     Patient family notified on 06/30/15 of transfer.  Name of family member notified:   [name])     PHYSICIAN Please sign FL2, Please prepare prescriptions     Additional Comment:     _______________________________________________ Shela Leff, LCSW 06/30/2015, 9:32 AM

## 2015-06-30 NOTE — Discharge Summary (Signed)
[place] [facility] [place] NAME: Barbara Chavez    MR#:  [id number]  DATE OF BIRTH:  1926/12/05  DATE OF ADMISSION:  06/27/2015 ADMITTING PHYSICIAN: Barbara Mandes, MD  DATE OF DISCHARGE: 06/30/15  PRIMARY CARE PHYSICIAN: Barbara Ruths., MD    ADMISSION DIAGNOSIS:  Hyponatremia [E87.1]  DISCHARGE DIAGNOSIS:  Acute renal fialure due to dehydration Hyponatremia-corrected  SECONDARY DIAGNOSIS:   Past Medical History  Diagnosis Date  . Hypertension   . Reflux   . Osteoarthritis   . Hypercholesterolemia   . Cancer     HOSPITAL COURSE:   81 year old Barbara Chavez with past medical history of non-Hodgkin's lymphoma undergoing chemotherapy at the cancer center, osteoarthritis comes to the emergency room with nausea vomiting and diarrhea for last several days. She was found to have  1. Acute hyponatremia secondary to dehydration from nausea vomiting. Received IV fluids with normal saline.  -sodium back to normal. D/c IVF  2. Acute renal failure/dehydration in the setting of nausea vomiting Treatment as above.  3 generalized weakness fatigue with fall at home per daughter Physical therapy recommends rehab  4. Non-Hodgkin's lymphoma per oncology as outpatient.  5. Relative hypotension resolved. Bp on the higher side Resume Clonidine,labetalol and prn hydralalzine  6 DVT prophylaxis subcutaneous Lovenox  Overall improved. D/c to rehab  DISCHARGE CONDITIONS:   fair  CONSULTS OBTAINED:   Dr Barbara Chavez  DRUG ALLERGIES:  No Known Allergies  DISCHARGE MEDICATIONS:   Current Discharge Medication List    CONTINUE these medications which have NOT CHANGED   Details  allopurinol (ZYLOPRIM) 300 MG tablet Take 1 tablet (300 mg total) by mouth daily. Qty: 30 tablet, Refills: 1    aspirin EC 81 MG tablet Take 81 mg by mouth daily.    atorvastatin (LIPITOR) 40 MG tablet Take 40 mg by mouth at bedtime.    busPIRone (BUSPAR) 15  MG tablet Take 30 mg by mouth 2 (two) times daily.    cetirizine (ZYRTEC) 10 MG tablet Take 10 mg by mouth daily as needed for allergies.    citalopram (CELEXA) 20 MG tablet Take 20 mg by mouth daily.    cloNIDine (CATAPRES) 0.2 MG tablet Take 0.2 mg by mouth 2 (two) times daily.    docusate sodium (COLACE) 100 MG capsule Take 100 mg by mouth every other day.    donepezil (ARICEPT) 10 MG tablet Take 10 mg by mouth at bedtime.    Fluticasone Propionate, Inhal, (FLOVENT DISKUS) 100 MCG/BLIST AEPB Inhale 1 puff into the lungs 2 (two) times daily as needed (for shortness of breath).    labetalol (NORMODYNE) 300 MG tablet Take 600 mg by mouth 2 (two) times daily.    potassium gluconate 595 MG TABS tablet Take 595 mg by mouth daily.    prochlorperazine (COMPAZINE) 10 MG tablet Take 1 tablet (10 mg total) by mouth every 6 (six) hours as needed for nausea or vomiting. Qty: 30 tablet, Refills: 0    promethazine (PHENERGAN) 12.5 MG tablet 1-2 tablets evey 6 hours prn for nausea and vomiting Qty: 60 tablet, Refills: 0    torsemide (DEMADEX) 20 MG tablet Take 20 mg by mouth daily.    traMADol (ULTRAM) 50 MG tablet Take 50-100 mg by mouth every 8 (eight) hours as needed for moderate pain.    vitamin B-12 (CYANOCOBALAMIN) 1000 MCG tablet Take 1,000 mcg by mouth daily.        If you experience worsening of your admission symptoms, develop  shortness of breath, life threatening emergency, suicidal or homicidal thoughts you must seek medical attention immediately by calling 911 or calling your MD immediately  if symptoms less severe.  You Must read complete instructions/literature along with all the possible adverse reactions/side effects for all the Medicines you take and that have been prescribed to you. Take any new Medicines after you have completely understood and accept all the possible adverse reactions/side effects.   Please note  You were cared for by a hospitalist during your hospital  stay. If you have any questions about your discharge medications or the care you received while you were in the hospital after you are discharged, you can call the unit and asked to speak with the hospitalist on call if the hospitalist that took care of you is not available. Once you are discharged, your primary care physician will handle any further medical issues. Please note that NO REFILLS for any discharge medications will be authorized once you are discharged, as it is imperative that you return to your primary care physician (or establish a relationship with a primary care physician if you do not have one) for your aftercare needs so that they can reassess your need for medications and monitor your lab values. Today   SUBJECTIVE  Feels ok   VITAL SIGNS:  Blood pressure 154/50, pulse 65, temperature 98.8 F (37.1 C), temperature source Oral, resp. rate 18, height 5\' 7"  (1.702 m), weight 100.336 kg (221 lb 3.2 oz), SpO2 98 %.  I/O:   Intake/Output Summary (Last 24 hours) at 06/30/15 0802 Last data filed at 06/29/15 1242  Gross per 24 hour  Intake    120 ml  Output    500 ml  Net   -380 ml    PHYSICAL EXAMINATION:  GENERAL:  81 y.o.-year-old patient lying in the bed with no acute distress.  EYES: Pupils equal, round, reactive to light and accommodation. No scleral icterus. Extraocular muscles intact.  HEENT: Head atraumatic, normocephalic. Oropharynx and nasopharynx clear.  NECK:  Supple, no jugular venous distention. No thyroid enlargement, no tenderness.  LUNGS: Normal breath sounds bilaterally, no wheezing, rales,rhonchi or crepitation. No use of accessory muscles of respiration.  CARDIOVASCULAR: S1, S2 normal. No murmurs, rubs, or gallops.  ABDOMEN: Soft, non-tender, non-distended. Bowel sounds present. No organomegaly or mass.  EXTREMITIES: No pedal edema, cyanosis, or clubbing.  NEUROLOGIC: Cranial nerves II through XII are intact. Muscle strength 5/5 in all extremities.  Sensation intact. Gait not checked.  PSYCHIATRIC:alert and oriented x 3.  SKIN: No obvious rash, lesion, or ulcer.   DATA REVIEW:   CBC   Recent Labs Lab 06/27/15 1728  WBC 9.5  HGB 9.7*  HCT 29.8*  PLT 264    Chemistries   Recent Labs Lab 06/25/15 1139  06/28/15 0457  NA 139  < > 137  K 3.6  < > 3.2*  CL 97*  < > 100*  CO2 32  < > 31  GLUCOSE 179*  < > 118*  BUN 18  < > 16  CREATININE 0.82  < > 1.04*  CALCIUM 9.4  < > 8.5*  MG 1.5*  --   --   AST 26  --   --   ALT 20  --   --   ALKPHOS 138*  --   --   BILITOT 0.5  --   --   < > = values in this interval not displayed.  Microbiology Results   No results found for  this or any previous visit (from the past 240 hour(s)).  RADIOLOGY:  No results found.   Management plans discussed with the patient, family and they are in agreement.  CODE STATUS:     Code Status Orders        Start     Ordered   06/27/15 2158  Full code   Continuous     06/27/15 2158    Advance Directive Documentation        Most Recent Value   Type of Advance Directive  Healthcare Power of Attorney   Pre-existing out of facility DNR order (yellow form or pink MOST form)     "MOST" Form in Place?        TOTAL TIME TAKING CARE OF THIS PATIENT: 40 minutes.    Chioma Mukherjee M.D on 06/30/2015 at 8:02 AM  Between 7am to 6pm - Pager - [phone] After 6pm go to [email or web address] - password [email or web address] [facility]  Office  [phone]  CC: Primary care physician; Barbara Ruths., MD

## 2015-06-30 NOTE — Plan of Care (Signed)
Problem: Discharge Progression Outcomes Goal: Other Discharge Outcomes/Goals Outcome: Progressing Plan of care progress to goal for: 1. Pain-no c/o pain this shift 2. Hemodynamically-             -VSS, pt remains afebrile this shift 3. Complications-held 9024 dose of Clonidine due to low BP 4. Diet-pt tolerating diet this shift 5. Activity-pt is 1 assist to the [facility]

## 2015-06-30 NOTE — Progress Notes (Addendum)
TC to dgt confirming that pt is discharged and she plans to provide transport to [facility] and will be available to sign facility paperwork. States she will be arriving soon and will do transport and paper work. TC back to Deborah, case manager with message left confirming transport and family availability.TC to [place], LPN with report called with pt accepted. Pt has met all discharge goals. Eating well. Denies co's. VSS. Discharge packet ready for transfer. Pt prepared for transport/discharge to facility. 

## 2015-06-30 NOTE — Progress Notes (Signed)
Pt discharged to [facility] in care of dgt to transport in private vehicle. Pt transported in [facility] to chair with 1+. Discharge packet given to dgt to give to nurse at [facility]. Condition stable. VSS. Ate well and tolerated. Discharged.

## 2015-06-30 NOTE — Clinical Social Work Note (Signed)
Bed offers extended and patient would like [facility]. CSW spoke with patient's daughter, [name], and she is in agreement. CSW spoke with nursing supervisor, Amy and she stated that patient could come today. Discharge summary sent and nurse to call report. Patient's daughter wishes to transport today.  Shela Leff MSW,LCSW [phone]

## 2015-07-12 NOTE — Progress Notes (Signed)
[place]  [phone]) [phone] Fax:(336) [phone]     ID: Barbara Chavez OB: May 15, 1927  MR#: [id number]  XKG#:[id number]  Patient Care Team: Kirk Ruths, MD as PCP - General (Internal Medicine) ROS  Chief Complaint:  Nausea, vomiting and diarrhea.      HPI: The patient seen as workin appt.    Patient states that last weekend she started having some pain in her abdomen. Patient states that around Saturday she began to have N&V and diarrhea. When this started the stomach pain went away. She states that she can not keep anything down, even water. She denies any pain at this time. No fevers. No bleeding issues.      Past Medical History  Diagnosis Date  . Hypertension   . Reflux   . Osteoarthritis   . Hypercholesterolemia   . Cancer     Past Surgical History  Procedure Laterality Date  . Cataract extraction    . Knee arthroscopy      Family History  Problem Relation Age of Onset  . Cancer Sister   . Breast cancer Sister     Social History:  reports that she has never smoked. She has never used smokeless tobacco. She reports that she does not drink alcohol or use illicit drugs.     Allergies: No Known Allergies  Current Medications: Current Outpatient Prescriptions  Medication Sig Dispense Refill  . allopurinol (ZYLOPRIM) 300 MG tablet Take 1 tablet (300 mg total) by mouth daily. 30 tablet 1  . aspirin EC 81 MG tablet Take 81 mg by mouth daily.    Marland Kitchen atorvastatin (LIPITOR) 40 MG tablet Take 40 mg by mouth at bedtime.    . busPIRone (BUSPAR) 15 MG tablet Take 30 mg by mouth 2 (two) times daily.    . cetirizine (ZYRTEC) 10 MG tablet Take 10 mg by mouth daily as needed for allergies.    . citalopram (CELEXA) 20 MG tablet Take 20 mg by mouth daily.    . cloNIDine (CATAPRES) 0.2 MG tablet Take 0.2 mg by mouth 2 (two) times daily.    Marland Kitchen docusate sodium (COLACE) 100 MG capsule Take 100 mg by mouth every other day.    . donepezil (ARICEPT)  10 MG tablet Take 10 mg by mouth at bedtime.    . Fluticasone Propionate, Inhal, (FLOVENT DISKUS) 100 MCG/BLIST AEPB Inhale 1 puff into the lungs 2 (two) times daily as needed (for shortness of breath).    . labetalol (NORMODYNE) 300 MG tablet Take 600 mg by mouth 2 (two) times daily.    . potassium gluconate 595 MG TABS tablet Take 595 mg by mouth daily.    . prochlorperazine (COMPAZINE) 10 MG tablet Take 1 tablet (10 mg total) by mouth every 6 (six) hours as needed for nausea or vomiting. 30 tablet 0  . promethazine (PHENERGAN) 12.5 MG tablet 1-2 tablets evey 6 hours prn for nausea and vomiting (Patient taking differently: Take 12.5-25 mg by mouth every 6 (six) hours as needed for nausea or vomiting. ) 60 tablet 0  . torsemide (DEMADEX) 20 MG tablet Take 20 mg by mouth daily.    . traMADol (ULTRAM) 50 MG tablet Take 50-100 mg by mouth every 8 (eight) hours as needed for moderate pain.    . vitamin B-12 (CYANOCOBALAMIN) 1000 MCG tablet Take 1,000 mcg by mouth daily.     No current facility-administered medications for this visit.   Review of Systems:  No fevers. No new headaches  or focal weakness. No sore throat, mouth sores. No shortness of breath or cough.  No hemoptysis. No chest pain, palpitations, orthopnea, or PND. No rashes or skin changes. No polyuria polydipsia.  Physical Exam: GENERAL:  Weak and tired -looking, otherwise alert and oriented, NAD. HEENT: EOMs intact. Mouth is dry. No thrush. CVS: S1S2, regular LUNGS: b/l good air entry ABDOMEN: soft, nontender EXTREMITIES:  Chronic lower extremity edema.  LABS -  Appointment on 06/25/2015  Component Date Value Ref Range Status  . WBC 06/25/2015 7.3  3.6 - 11.0 K/uL Final  . RBC 06/25/2015 3.37* 3.80 - 5.20 MIL/uL Final  . Hemoglobin 06/25/2015 9.6* 12.0 - 16.0 g/dL Final  . HCT 06/25/2015 29.1* 35.0 - 47.0 % Final  . MCV 06/25/2015 86.2  80.0 - 100.0 fL Final  . MCH 06/25/2015 28.6  26.0 - 34.0 pg Final  . MCHC 06/25/2015  33.2  32.0 - 36.0 g/dL Final  . RDW 06/25/2015 16.1* 11.5 - 14.5 % Final  . Platelets 06/25/2015 260  150 - 440 K/uL Final  . Neutrophils Relative % 06/25/2015 78   Final  . Neutro Abs 06/25/2015 5.7  1.4 - 6.5 K/uL Final  . Lymphocytes Relative 06/25/2015 15   Final  . Lymphs Abs 06/25/2015 1.1  1.0 - 3.6 K/uL Final  . Monocytes Relative 06/25/2015 6   Final  . Monocytes Absolute 06/25/2015 0.4  0.2 - 0.9 K/uL Final  . Eosinophils Relative 06/25/2015 0   Final  . Eosinophils Absolute 06/25/2015 0.0  0 - 0.7 K/uL Final  . Basophils Relative 06/25/2015 1   Final  . Basophils Absolute 06/25/2015 0.1  0 - 0.1 K/uL Final  . Sodium 06/25/2015 139  135 - 145 mmol/L Final  . Potassium 06/25/2015 3.6  3.5 - 5.1 mmol/L Final  . Chloride 06/25/2015 97* 101 - 111 mmol/L Final  . CO2 06/25/2015 32  22 - 32 mmol/L Final  . Glucose, Bld 06/25/2015 179* 65 - 99 mg/dL Final  . BUN 06/25/2015 18  6 - 20 mg/dL Final  . Creatinine, Ser 06/25/2015 0.82  0.44 - 1.00 mg/dL Final  . Calcium 06/25/2015 9.4  8.9 - 10.3 mg/dL Final  . Total Protein 06/25/2015 8.1  6.5 - 8.1 g/dL Final  . Albumin 06/25/2015 3.8  3.5 - 5.0 g/dL Final  . AST 06/25/2015 26  15 - 41 U/L Final  . ALT 06/25/2015 20  14 - 54 U/L Final  . Alkaline Phosphatase 06/25/2015 138* 38 - 126 U/L Final  . Total Bilirubin 06/25/2015 0.5  0.3 - 1.2 mg/dL Final  . GFR calc non Af Amer 06/25/2015 >60  >60 mL/min Final  . GFR calc Af Amer 06/25/2015 >60  >60 mL/min Final   Comment: (NOTE) The eGFR has been calculated using the CKD EPI equation. This calculation has not been validated in all clinical situations. eGFR's persistently <60 mL/min signify possible Chronic Kidney Disease.   . Anion gap 06/25/2015 10  5 - 15 Final  . Magnesium 06/25/2015 1.5* 1.7 - 2.4 mg/dL Final    Assessment / Plan:  1. Nausea, vomiting, diarrhea. Has clinical dehydration - will give 2 liters IV fluid with antiemetics today. Will send stool sample for analysis if  she is able to give sample. Advised to take Imodium prn for diarrhea. Will request f/u with Dr.Finnegan on 8/16, possible IV fluids (4 hours).  2. Lymph node biopsy on 05/08/15 revealed grade I follicular lymphoma.She was advised to keep other previously scheduled appt  the same. 3. In between visits, she was advised to call or come to ER in case of worsening symptoms or acute sickness. She is agreeable to this plan.

## 2015-07-25 ENCOUNTER — Telehealth: Payer: Self-pay | Admitting: *Deleted

## 2015-07-25 ENCOUNTER — Inpatient Hospital Stay: Payer: Medicare Other | Attending: Oncology

## 2015-07-25 DIAGNOSIS — F05 Delirium due to known physiological condition: Secondary | ICD-10-CM

## 2015-07-25 DIAGNOSIS — C8590 Non-Hodgkin lymphoma, unspecified, unspecified site: Secondary | ICD-10-CM

## 2015-07-25 DIAGNOSIS — E780 Pure hypercholesterolemia: Secondary | ICD-10-CM | POA: Diagnosis not present

## 2015-07-25 DIAGNOSIS — M25511 Pain in right shoulder: Secondary | ICD-10-CM | POA: Insufficient documentation

## 2015-07-25 DIAGNOSIS — R5383 Other fatigue: Secondary | ICD-10-CM | POA: Insufficient documentation

## 2015-07-25 DIAGNOSIS — F0390 Unspecified dementia without behavioral disturbance: Secondary | ICD-10-CM | POA: Insufficient documentation

## 2015-07-25 DIAGNOSIS — Z79899 Other long term (current) drug therapy: Secondary | ICD-10-CM | POA: Diagnosis not present

## 2015-07-25 DIAGNOSIS — R531 Weakness: Secondary | ICD-10-CM | POA: Diagnosis not present

## 2015-07-25 DIAGNOSIS — I10 Essential (primary) hypertension: Secondary | ICD-10-CM | POA: Insufficient documentation

## 2015-07-25 DIAGNOSIS — M25512 Pain in left shoulder: Secondary | ICD-10-CM | POA: Diagnosis not present

## 2015-07-25 DIAGNOSIS — R350 Frequency of micturition: Secondary | ICD-10-CM

## 2015-07-25 DIAGNOSIS — M1990 Unspecified osteoarthritis, unspecified site: Secondary | ICD-10-CM | POA: Insufficient documentation

## 2015-07-25 DIAGNOSIS — K219 Gastro-esophageal reflux disease without esophagitis: Secondary | ICD-10-CM | POA: Insufficient documentation

## 2015-07-25 DIAGNOSIS — C8290 Follicular lymphoma, unspecified, unspecified site: Secondary | ICD-10-CM | POA: Insufficient documentation

## 2015-07-25 DIAGNOSIS — D649 Anemia, unspecified: Secondary | ICD-10-CM | POA: Insufficient documentation

## 2015-07-25 LAB — URINALYSIS COMPLETE WITH MICROSCOPIC (ARMC ONLY)
BILIRUBIN URINE: NEGATIVE
Glucose, UA: NEGATIVE mg/dL
HGB URINE DIPSTICK: NEGATIVE
Ketones, ur: NEGATIVE mg/dL
Nitrite: NEGATIVE
PH: 5 (ref 5.0–8.0)
Protein, ur: NEGATIVE mg/dL
SPECIFIC GRAVITY, URINE: 1.011 (ref 1.005–1.030)

## 2015-07-25 LAB — CBC WITH DIFFERENTIAL/PLATELET
BASOS ABS: 0.1 10*3/uL (ref 0–0.1)
Basophils Relative: 1 %
Eosinophils Absolute: 0.2 10*3/uL (ref 0–0.7)
Eosinophils Relative: 3 %
HCT: 27.3 % — ABNORMAL LOW (ref 35.0–47.0)
HEMOGLOBIN: 8.8 g/dL — AB (ref 12.0–16.0)
LYMPHS ABS: 1.6 10*3/uL (ref 1.0–3.6)
LYMPHS PCT: 24 %
MCH: 28.8 pg (ref 26.0–34.0)
MCHC: 32.4 g/dL (ref 32.0–36.0)
MCV: 88.8 fL (ref 80.0–100.0)
Monocytes Absolute: 0.8 10*3/uL (ref 0.2–0.9)
Monocytes Relative: 12 %
NEUTROS ABS: 4 10*3/uL (ref 1.4–6.5)
NEUTROS PCT: 60 %
Platelets: 244 10*3/uL (ref 150–440)
RBC: 3.07 MIL/uL — AB (ref 3.80–5.20)
RDW: 16.6 % — ABNORMAL HIGH (ref 11.5–14.5)
WBC: 6.7 10*3/uL (ref 3.6–11.0)

## 2015-07-25 LAB — COMPREHENSIVE METABOLIC PANEL
ALT: 17 U/L (ref 14–54)
ANION GAP: 9 (ref 5–15)
AST: 24 U/L (ref 15–41)
Albumin: 3.2 g/dL — ABNORMAL LOW (ref 3.5–5.0)
Alkaline Phosphatase: 130 U/L — ABNORMAL HIGH (ref 38–126)
BUN: 26 mg/dL — AB (ref 6–20)
CHLORIDE: 100 mmol/L — AB (ref 101–111)
CO2: 32 mmol/L (ref 22–32)
Calcium: 9 mg/dL (ref 8.9–10.3)
Creatinine, Ser: 1.53 mg/dL — ABNORMAL HIGH (ref 0.44–1.00)
GFR, EST AFRICAN AMERICAN: 34 mL/min — AB (ref >60)
GFR, EST NON AFRICAN AMERICAN: 30 mL/min — AB (ref >60)
Glucose, Bld: 102 mg/dL — ABNORMAL HIGH (ref 65–99)
POTASSIUM: 3.8 mmol/L (ref 3.5–5.1)
Sodium: 141 mmol/L (ref 135–145)
Total Bilirubin: 0.7 mg/dL (ref 0.3–1.2)
Total Protein: 6.6 g/dL (ref 6.5–8.1)

## 2015-07-25 LAB — LACTATE DEHYDROGENASE: LDH: 187 U/L (ref 98–192)

## 2015-07-25 LAB — URIC ACID: Uric Acid, Serum: 4.1 mg/dL (ref 2.3–6.6)

## 2015-07-25 NOTE — Telephone Encounter (Signed)
Thinks she has an infection, she is completely confused and talking pout of her head. She is not eating or drinking much at all, She reports  That she is voiding frequently. Denies fever. Asking for pt to be evaluated. Urine and culture ordered per Dr Grayland Ormond. Labs for Mondays appt will be drawn today as well. She will wait for results before leaving

## 2015-07-26 ENCOUNTER — Other Ambulatory Visit: Payer: Self-pay | Admitting: *Deleted

## 2015-07-26 DIAGNOSIS — N390 Urinary tract infection, site not specified: Secondary | ICD-10-CM

## 2015-07-26 LAB — URINE CULTURE

## 2015-07-26 MED ORDER — SULFAMETHOXAZOLE-TRIMETHOPRIM 800-160 MG PO TABS: 1.0000 | ORAL_TABLET | Freq: Two times a day (BID) | ORAL | Status: DC

## 2015-07-30 ENCOUNTER — Inpatient Hospital Stay: Payer: Medicare Other

## 2015-07-30 ENCOUNTER — Inpatient Hospital Stay (HOSPITAL_BASED_OUTPATIENT_CLINIC_OR_DEPARTMENT_OTHER): Payer: Medicare Other | Admitting: Oncology

## 2015-07-30 ENCOUNTER — Ambulatory Visit: Payer: Medicare Other

## 2015-07-30 VITALS — BP 160/77 | HR 72 | Temp 96.1°F | Resp 18

## 2015-07-30 DIAGNOSIS — I10 Essential (primary) hypertension: Secondary | ICD-10-CM

## 2015-07-30 DIAGNOSIS — C8290 Follicular lymphoma, unspecified, unspecified site: Secondary | ICD-10-CM

## 2015-07-30 DIAGNOSIS — R5383 Other fatigue: Secondary | ICD-10-CM

## 2015-07-30 DIAGNOSIS — K219 Gastro-esophageal reflux disease without esophagitis: Secondary | ICD-10-CM

## 2015-07-30 DIAGNOSIS — Z79899 Other long term (current) drug therapy: Secondary | ICD-10-CM

## 2015-07-30 DIAGNOSIS — R531 Weakness: Secondary | ICD-10-CM

## 2015-07-30 DIAGNOSIS — F0390 Unspecified dementia without behavioral disturbance: Secondary | ICD-10-CM

## 2015-07-30 DIAGNOSIS — M25511 Pain in right shoulder: Secondary | ICD-10-CM | POA: Diagnosis not present

## 2015-07-30 DIAGNOSIS — E780 Pure hypercholesterolemia: Secondary | ICD-10-CM

## 2015-07-30 DIAGNOSIS — D649 Anemia, unspecified: Secondary | ICD-10-CM | POA: Diagnosis not present

## 2015-07-30 DIAGNOSIS — M1990 Unspecified osteoarthritis, unspecified site: Secondary | ICD-10-CM

## 2015-07-30 DIAGNOSIS — M25512 Pain in left shoulder: Secondary | ICD-10-CM

## 2015-07-30 NOTE — Progress Notes (Signed)
Patient is accompanied by her daughter who has questions regarding her mom's bilateral shoulder pain R>L possibly being caused by cancer lesion.  Ms. Corporan also has an increase in her dementia symptoms.

## 2015-08-05 NOTE — Progress Notes (Signed)
[place]  Telephone:(336) [phone] Fax:(336) [phone]  ID: Barbara Chavez OB: [birth year]  MR#: [id number]  [id number]#:[id number]  Patient Care Team: Kirk Ruths, MD as PCP - General (Internal Medicine)  CHIEF COMPLAINT:  Chief Complaint  Patient presents with  . Follow-up    follicular lymphoma    INTERVAL HISTORY: Patient returns to clinic today for routine evaluation. Patient was recently found to have a UTI. Her symptoms have resolved with antibiotics. She continues to feel weakness and fatigue. She continues to have bilateral shoulder pain. She also has worsening dementia per her daughter.  She denies any fevers. She has no neurologic complaints. She denies any chest pain or shortness of breath. She has a poor appetite. Patient offers no further specific complaints today.  REVIEW OF SYSTEMS:   Review of Systems  Constitutional: Positive for malaise/fatigue. Negative for fever and weight loss.  Respiratory: Negative.   Cardiovascular: Negative.   Gastrointestinal: Negative for nausea and vomiting.  Genitourinary: Negative.   Musculoskeletal: Positive for joint pain.  Neurological: Positive for weakness.  Psychiatric/Behavioral: Positive for memory loss.    As per HPI. Otherwise, a complete review of systems is negatve.  PAST MEDICAL HISTORY: Past Medical History  Diagnosis Date  . Hypertension   . Reflux   . Osteoarthritis   . Hypercholesterolemia   . Cancer     PAST SURGICAL HISTORY: Past Surgical History  Procedure Laterality Date  . Cataract extraction    . Knee arthroscopy      FAMILY HISTORY Family History  Problem Relation Age of Onset  . Cancer Sister   . Breast cancer Sister        ADVANCED DIRECTIVES:    HEALTH MAINTENANCE: Social History  Substance Use Topics  . Smoking status: Never Smoker   . Smokeless tobacco: Never Used  . Alcohol Use: No     Colonoscopy:  PAP:  Bone density:  Lipid panel:  No  Known Allergies  Current Outpatient Prescriptions  Medication Sig Dispense Refill  . allopurinol (ZYLOPRIM) 300 MG tablet Take 1 tablet (300 mg total) by mouth daily. 30 tablet 1  . allopurinol (ZYLOPRIM) 300 MG tablet Take by mouth.    Marland Kitchen aspirin EC 81 MG tablet Take 81 mg by mouth daily.    Marland Kitchen atorvastatin (LIPITOR) 40 MG tablet Take 40 mg by mouth at bedtime.    . busPIRone (BUSPAR) 15 MG tablet Take 30 mg by mouth 2 (two) times daily.    . cetirizine (ZYRTEC) 10 MG tablet Take 10 mg by mouth daily as needed for allergies.    . citalopram (CELEXA) 20 MG tablet Take 20 mg by mouth daily.    . cloNIDine (CATAPRES) 0.2 MG tablet Take 0.2 mg by mouth 2 (two) times daily.    Marland Kitchen docusate sodium (COLACE) 100 MG capsule Take 100 mg by mouth every other day.    . donepezil (ARICEPT) 10 MG tablet Take 10 mg by mouth at bedtime.    . Fluticasone Propionate, Inhal, (FLOVENT DISKUS) 100 MCG/BLIST AEPB Inhale 1 puff into the lungs 2 (two) times daily as needed (for shortness of breath).    . labetalol (NORMODYNE) 300 MG tablet Take 600 mg by mouth 2 (two) times daily.    Marland Kitchen omeprazole (PRILOSEC) 20 MG capsule     . potassium gluconate 595 MG TABS tablet Take 595 mg by mouth daily.    . prochlorperazine (COMPAZINE) 10 MG tablet Take 1 tablet (10 mg total) by mouth every  6 (six) hours as needed for nausea or vomiting. 30 tablet 0  . promethazine (PHENERGAN) 12.5 MG tablet 1-2 tablets evey 6 hours prn for nausea and vomiting (Patient taking differently: Take 12.5-25 mg by mouth every 6 (six) hours as needed for nausea or vomiting. ) 60 tablet 0  . sulfamethoxazole-trimethoprim (BACTRIM DS,SEPTRA DS) 800-160 MG per tablet Take 1 tablet by mouth 2 (two) times daily. 14 tablet 0  . torsemide (DEMADEX) 20 MG tablet Take 20 mg by mouth daily.    . traMADol (ULTRAM) 50 MG tablet Take 50-100 mg by mouth every 8 (eight) hours as needed for moderate pain.    . traZODone (DESYREL) 50 MG tablet Take by mouth.    .  vitamin B-12 (CYANOCOBALAMIN) 1000 MCG tablet Take 1,000 mcg by mouth daily.     No current facility-administered medications for this visit.    OBJECTIVE: Filed Vitals:   07/30/15 1136  BP: 160/77  Pulse: 72  Temp: 96.1 F (35.6 C)  Resp: 18     There is no weight on file to calculate BMI.    ECOG FS:1 - Symptomatic but completely ambulatory  General: Well-developed, well-nourished, no acute distress. Eyes: Pink conjunctiva, anicteric sclera. Lungs: Clear to auscultation bilaterally. Heart: Regular rate and rhythm. No rubs, murmurs, or gallops. Abdomen: Soft, nontender, nondistended. No organomegaly noted, normoactive bowel sounds. Musculoskeletal: No edema, cyanosis, or clubbing. Neuro: Alert, answering all questions appropriately. Cranial nerves grossly intact. Skin: No rashes or petechiae noted. Psych: Normal affect. Lymphatics: No cervical, calvicular, axillary or inguinal LAD.   LAB RESULTS:  Lab Results  Component Value Date   NA 141 07/25/2015   K 3.8 07/25/2015   CL 100* 07/25/2015   CO2 32 07/25/2015   GLUCOSE 102* 07/25/2015   BUN 26* 07/25/2015   CREATININE 1.53* 07/25/2015   CALCIUM 9.0 07/25/2015   PROT 6.6 07/25/2015   ALBUMIN 3.2* 07/25/2015   AST 24 07/25/2015   ALT 17 07/25/2015   ALKPHOS 130* 07/25/2015   BILITOT 0.7 07/25/2015   GFRNONAA 30* 07/25/2015   GFRAA 34* 07/25/2015    Lab Results  Component Value Date   WBC 6.7 07/25/2015   NEUTROABS 4.0 07/25/2015   HGB 8.8* 07/25/2015   HCT 27.3* 07/25/2015   MCV 88.8 07/25/2015   PLT 244 07/25/2015     STUDIES: No results found.  ASSESSMENT: Grade 1, at least stage III follicular lymphoma.  PLAN:    1. Follicular lymphoma: PET scan in June 2016 revealed diffuse hypermetabolic lymphadenopathy. Biopsy confirmed follicular lymphoma. No bone marrow biopsy was performed.  Patient completed weekly Rituxan 4 on June 20, 2015.  Will repeat imaging at the end of October to assess for  interval change. 2. UTI: Resolved.  3. Anemia: Mild. Repeat iron stores at next clinic visit and consider IV Feraheme. 4. Shoulder pain: Bone scan and PET results previously reviewed revealing significant abnormalities, but not concerning for progressive lymphoma. Continue symptomatic treatment.  Patient expressed understanding and was in agreement with this plan. She also understands that She can call clinic at any time with any questions, concerns, or complaints.   Lloyd Huger, MD   08/05/2015 12:20 PM

## 2015-08-06 ENCOUNTER — Encounter: Payer: Self-pay | Admitting: Hematology and Oncology

## 2015-09-03 ENCOUNTER — Inpatient Hospital Stay: Payer: Medicare Other | Attending: Oncology

## 2015-09-03 ENCOUNTER — Ambulatory Visit
Admission: RE | Admit: 2015-09-03 | Discharge: 2015-09-03 | Disposition: A | Discharge status: Home / Self Care | Payer: Medicare Other | Source: Ambulatory Visit | Attending: Oncology | Admitting: Oncology

## 2015-09-03 DIAGNOSIS — M25511 Pain in right shoulder: Secondary | ICD-10-CM | POA: Insufficient documentation

## 2015-09-03 DIAGNOSIS — C8290 Follicular lymphoma, unspecified, unspecified site: Secondary | ICD-10-CM | POA: Insufficient documentation

## 2015-09-03 DIAGNOSIS — Z7982 Long term (current) use of aspirin: Secondary | ICD-10-CM | POA: Diagnosis not present

## 2015-09-03 DIAGNOSIS — M1990 Unspecified osteoarthritis, unspecified site: Secondary | ICD-10-CM | POA: Diagnosis not present

## 2015-09-03 DIAGNOSIS — K219 Gastro-esophageal reflux disease without esophagitis: Secondary | ICD-10-CM | POA: Diagnosis not present

## 2015-09-03 DIAGNOSIS — N2889 Other specified disorders of kidney and ureter: Secondary | ICD-10-CM | POA: Insufficient documentation

## 2015-09-03 DIAGNOSIS — R918 Other nonspecific abnormal finding of lung field: Secondary | ICD-10-CM | POA: Insufficient documentation

## 2015-09-03 DIAGNOSIS — Z79899 Other long term (current) drug therapy: Secondary | ICD-10-CM | POA: Diagnosis not present

## 2015-09-03 DIAGNOSIS — E7800 Pure hypercholesterolemia, unspecified: Secondary | ICD-10-CM | POA: Diagnosis not present

## 2015-09-03 DIAGNOSIS — I10 Essential (primary) hypertension: Secondary | ICD-10-CM | POA: Insufficient documentation

## 2015-09-03 DIAGNOSIS — D649 Anemia, unspecified: Secondary | ICD-10-CM | POA: Diagnosis not present

## 2015-09-03 DIAGNOSIS — K429 Umbilical hernia without obstruction or gangrene: Secondary | ICD-10-CM | POA: Diagnosis not present

## 2015-09-03 DIAGNOSIS — M25512 Pain in left shoulder: Secondary | ICD-10-CM | POA: Insufficient documentation

## 2015-09-03 DIAGNOSIS — F0390 Unspecified dementia without behavioral disturbance: Secondary | ICD-10-CM | POA: Insufficient documentation

## 2015-09-03 DIAGNOSIS — I700 Atherosclerosis of aorta: Secondary | ICD-10-CM | POA: Insufficient documentation

## 2015-09-03 LAB — CBC WITH DIFFERENTIAL/PLATELET
BASOS ABS: 0.1 10*3/uL (ref 0–0.1)
BASOS PCT: 2 %
EOS ABS: 0.2 10*3/uL (ref 0–0.7)
EOS PCT: 3 %
HCT: 25.8 % — ABNORMAL LOW (ref 35.0–47.0)
Hemoglobin: 8.6 g/dL — ABNORMAL LOW (ref 12.0–16.0)
Lymphocytes Relative: 24 %
Lymphs Abs: 1.6 10*3/uL (ref 1.0–3.6)
MCH: 29.6 pg (ref 26.0–34.0)
MCHC: 33.2 g/dL (ref 32.0–36.0)
MCV: 89.3 fL (ref 80.0–100.0)
MONO ABS: 0.8 10*3/uL (ref 0.2–0.9)
Monocytes Relative: 13 %
Neutro Abs: 3.8 10*3/uL (ref 1.4–6.5)
Neutrophils Relative %: 58 %
PLATELETS: 271 10*3/uL (ref 150–440)
RBC: 2.89 MIL/uL — AB (ref 3.80–5.20)
RDW: 17.5 % — AB (ref 11.5–14.5)
WBC: 6.5 10*3/uL (ref 3.6–11.0)

## 2015-09-03 LAB — IRON AND TIBC
Iron: 34 ug/dL (ref 28–170)
Iron: 35 ug/dL (ref 28–170)
SATURATION RATIOS: 11 % (ref 10.4–31.8)
Saturation Ratios: 12 % (ref 10.4–31.8)
TIBC: 290 ug/dL (ref 250–450)
TIBC: 317 ug/dL (ref 250–450)
UIBC: 255 ug/dL
UIBC: 283 ug/dL

## 2015-09-03 LAB — BASIC METABOLIC PANEL
ANION GAP: 7 (ref 5–15)
BUN: 28 mg/dL — AB (ref 6–20)
CALCIUM: 8.5 mg/dL — AB (ref 8.9–10.3)
CHLORIDE: 99 mmol/L — AB (ref 101–111)
CO2: 32 mmol/L (ref 22–32)
Creatinine, Ser: 1.17 mg/dL — ABNORMAL HIGH (ref 0.44–1.00)
GFR, EST AFRICAN AMERICAN: 47 mL/min — AB (ref >60)
GFR, EST NON AFRICAN AMERICAN: 41 mL/min — AB (ref >60)
Glucose, Bld: 110 mg/dL — ABNORMAL HIGH (ref 65–99)
POTASSIUM: 3.9 mmol/L (ref 3.5–5.1)
Sodium: 138 mmol/L (ref 135–145)

## 2015-09-03 LAB — FERRITIN
FERRITIN: 20 ng/mL (ref 11–307)
FERRITIN: 21 ng/mL (ref 11–307)

## 2015-09-05 ENCOUNTER — Encounter
Admission: RE | Admit: 2015-09-05 | Discharge: 2015-09-05 | Disposition: A | Discharge status: Home / Self Care | Payer: Medicare Other | Source: Ambulatory Visit | Attending: Oncology | Admitting: Oncology

## 2015-09-05 ENCOUNTER — Inpatient Hospital Stay (HOSPITAL_BASED_OUTPATIENT_CLINIC_OR_DEPARTMENT_OTHER): Payer: Medicare Other | Admitting: Oncology

## 2015-09-05 ENCOUNTER — Inpatient Hospital Stay: Payer: Medicare Other

## 2015-09-05 VITALS — BP 160/77 | HR 60 | Temp 98.9°F | Wt 233.0 lb

## 2015-09-05 DIAGNOSIS — D649 Anemia, unspecified: Secondary | ICD-10-CM | POA: Diagnosis not present

## 2015-09-05 DIAGNOSIS — N2889 Other specified disorders of kidney and ureter: Secondary | ICD-10-CM | POA: Diagnosis not present

## 2015-09-05 DIAGNOSIS — M1990 Unspecified osteoarthritis, unspecified site: Secondary | ICD-10-CM

## 2015-09-05 DIAGNOSIS — E7800 Pure hypercholesterolemia, unspecified: Secondary | ICD-10-CM

## 2015-09-05 DIAGNOSIS — M25511 Pain in right shoulder: Secondary | ICD-10-CM

## 2015-09-05 DIAGNOSIS — F0390 Unspecified dementia without behavioral disturbance: Secondary | ICD-10-CM

## 2015-09-05 DIAGNOSIS — M25512 Pain in left shoulder: Secondary | ICD-10-CM | POA: Diagnosis not present

## 2015-09-05 DIAGNOSIS — K219 Gastro-esophageal reflux disease without esophagitis: Secondary | ICD-10-CM

## 2015-09-05 DIAGNOSIS — Z7982 Long term (current) use of aspirin: Secondary | ICD-10-CM

## 2015-09-05 DIAGNOSIS — C8290 Follicular lymphoma, unspecified, unspecified site: Secondary | ICD-10-CM | POA: Insufficient documentation

## 2015-09-05 DIAGNOSIS — R918 Other nonspecific abnormal finding of lung field: Secondary | ICD-10-CM

## 2015-09-05 DIAGNOSIS — I10 Essential (primary) hypertension: Secondary | ICD-10-CM

## 2015-09-05 DIAGNOSIS — I700 Atherosclerosis of aorta: Secondary | ICD-10-CM

## 2015-09-05 DIAGNOSIS — Z79899 Other long term (current) drug therapy: Secondary | ICD-10-CM

## 2015-09-05 DIAGNOSIS — K429 Umbilical hernia without obstruction or gangrene: Secondary | ICD-10-CM

## 2015-09-05 LAB — GLUCOSE, CAPILLARY: GLUCOSE-CAPILLARY: 83 mg/dL (ref 65–99)

## 2015-09-05 MED ORDER — FLUDEOXYGLUCOSE F - 18 (FDG) INJECTION
12.7000 | Freq: Once | INTRAVENOUS | Status: DC | PRN
  Administered 2015-09-05: 12.7 via INTRAVENOUS
  Filled 2015-09-05: qty 12.7

## 2015-09-05 NOTE — Progress Notes (Signed)
Pt Alert ,x3, brought to exam room 7 via wheelchair, pt denies pain or discomfort.  Pt's daughter in room with her.

## 2015-09-15 NOTE — Progress Notes (Signed)
[place]  Telephone:(336) [phone] Fax:(336) [phone]  ID: Barbara Chavez OB: [birth year]  MR#: [id number]  XTK#:[id number]  Patient Care Team: Kirk Ruths, MD as PCP - General (Internal Medicine)  CHIEF COMPLAINT:  No chief complaint on file.   INTERVAL HISTORY: Patient returns to clinic today for routine 3 month evaluation. She continues to have persistent weakness and fatigue. She continues to have bilateral shoulder pain. She also has worsening dementia per her daughter.  She denies any fevers. She has no neurologic complaints. She denies any chest pain or shortness of breath. She has a poor appetite. Patient offers no further specific complaints today.  REVIEW OF SYSTEMS:   Review of Systems  Constitutional: Positive for malaise/fatigue. Negative for fever and weight loss.  Respiratory: Negative.   Cardiovascular: Negative.   Gastrointestinal: Negative for nausea and vomiting.  Genitourinary: Negative.   Musculoskeletal: Positive for joint pain.  Neurological: Positive for weakness.  Psychiatric/Behavioral: Positive for memory loss.    As per HPI. Otherwise, a complete review of systems is negatve.  PAST MEDICAL HISTORY: Past Medical History  Diagnosis Date  . Hypertension   . Reflux   . Osteoarthritis   . Hypercholesterolemia   . Cancer     PAST SURGICAL HISTORY: Past Surgical History  Procedure Laterality Date  . Cataract extraction    . Knee arthroscopy      FAMILY HISTORY Family History  Problem Relation Age of Onset  . Cancer Sister   . Breast cancer Sister        ADVANCED DIRECTIVES:    HEALTH MAINTENANCE: Social History  Substance Use Topics  . Smoking status: Never Smoker   . Smokeless tobacco: Never Used  . Alcohol Use: No     Colonoscopy:  PAP:  Bone density:  Lipid panel:  No Known Allergies  Current Outpatient Prescriptions  Medication Sig Dispense Refill  . allopurinol (ZYLOPRIM) 300 MG  tablet Take 1 tablet (300 mg total) by mouth daily. 30 tablet 1  . allopurinol (ZYLOPRIM) 300 MG tablet Take by mouth.    Marland Kitchen aspirin EC 81 MG tablet Take 81 mg by mouth daily.    Marland Kitchen atorvastatin (LIPITOR) 40 MG tablet Take 40 mg by mouth at bedtime.    . busPIRone (BUSPAR) 15 MG tablet Take 30 mg by mouth 2 (two) times daily.    . cetirizine (ZYRTEC) 10 MG tablet Take 10 mg by mouth daily as needed for allergies.    . citalopram (CELEXA) 20 MG tablet Take 20 mg by mouth daily.    . cloNIDine (CATAPRES) 0.2 MG tablet Take 0.2 mg by mouth 2 (two) times daily.    Marland Kitchen docusate sodium (COLACE) 100 MG capsule Take 100 mg by mouth every other day.    . donepezil (ARICEPT) 10 MG tablet Take 10 mg by mouth at bedtime.    . Fluticasone Propionate, Inhal, (FLOVENT DISKUS) 100 MCG/BLIST AEPB Inhale 1 puff into the lungs 2 (two) times daily as needed (for shortness of breath).    . labetalol (NORMODYNE) 300 MG tablet Take 600 mg by mouth 2 (two) times daily.    Marland Kitchen omeprazole (PRILOSEC) 20 MG capsule     . potassium gluconate 595 MG TABS tablet Take 595 mg by mouth daily.    . prochlorperazine (COMPAZINE) 10 MG tablet Take 1 tablet (10 mg total) by mouth every 6 (six) hours as needed for nausea or vomiting. 30 tablet 0  . promethazine (PHENERGAN) 12.5 MG tablet 1-2 tablets  evey 6 hours prn for nausea and vomiting (Patient not taking: Reported on 09/05/2015) 60 tablet 0  . sulfamethoxazole-trimethoprim (BACTRIM DS,SEPTRA DS) 800-160 MG per tablet Take 1 tablet by mouth 2 (two) times daily. (Patient not taking: Reported on 09/05/2015) 14 tablet 0  . torsemide (DEMADEX) 20 MG tablet Take 20 mg by mouth daily.    . traMADol (ULTRAM) 50 MG tablet Take 50-100 mg by mouth every 8 (eight) hours as needed for moderate pain.    . traZODone (DESYREL) 50 MG tablet Take by mouth.    . vitamin B-12 (CYANOCOBALAMIN) 1000 MCG tablet Take 1,000 mcg by mouth daily.     No current facility-administered medications for this visit.      OBJECTIVE: Filed Vitals:   09/05/15 1352  BP: 160/77  Pulse: 60  Temp: 98.9 F (37.2 C)     Body mass index is 36.48 kg/(m^2).    ECOG FS:1 - Symptomatic but completely ambulatory  General: Well-developed, well-nourished, no acute distress. Eyes: Pink conjunctiva, anicteric sclera. Lungs: Clear to auscultation bilaterally. Heart: Regular rate and rhythm. No rubs, murmurs, or gallops. Abdomen: Soft, nontender, nondistended. No organomegaly noted, normoactive bowel sounds. Musculoskeletal: No edema, cyanosis, or clubbing. Neuro: Alert, answering all questions appropriately. Cranial nerves grossly intact. Skin: No rashes or petechiae noted. Psych: Normal affect. Lymphatics: No cervical, calvicular, axillary or inguinal LAD.   LAB RESULTS:  Lab Results  Component Value Date   NA 138 09/03/2015   K 3.9 09/03/2015   CL 99* 09/03/2015   CO2 32 09/03/2015   GLUCOSE 110* 09/03/2015   BUN 28* 09/03/2015   CREATININE 1.17* 09/03/2015   CALCIUM 8.5* 09/03/2015   PROT 6.6 07/25/2015   ALBUMIN 3.2* 07/25/2015   AST 24 07/25/2015   ALT 17 07/25/2015   ALKPHOS 130* 07/25/2015   BILITOT 0.7 07/25/2015   GFRNONAA 41* 09/03/2015   GFRAA 47* 09/03/2015    Lab Results  Component Value Date   WBC 6.5 09/03/2015   NEUTROABS 3.8 09/03/2015   HGB 8.6* 09/03/2015   HCT 25.8* 09/03/2015   MCV 89.3 09/03/2015   PLT 271 09/03/2015     STUDIES: Nm Pet Image Restag (ps) Skull Base To Thigh  09/05/2015  CLINICAL DATA:  Subsequent treatment strategy for follicular lymphoma status post Rituxan. EXAM: NUCLEAR MEDICINE PET SKULL BASE TO THIGH TECHNIQUE: 12.7 mCi F-18 FDG was injected intravenously. Full-ring PET imaging was performed from the skull base to thigh after the radiotracer. CT data was obtained and used for attenuation correction and anatomic localization. FASTING BLOOD GLUCOSE:  Value: 83 mg/dl COMPARISON:  04/26/2015 PET-CT. FINDINGS: NECK No hypermetabolic lymph nodes in the  neck. CHEST No hypermetabolic axillary, mediastinal or hilar nodes. The previously hypermetabolic left axillary node demonstrates no residual hypermetabolism. Stable mild cardiomegaly. There is atherosclerosis of the thoracic aorta, the great vessels of the mediastinum and the coronary arteries, including calcified atherosclerotic plaque in the left anterior descending, left circumflex and right coronary arteries. Stable 3 mm pulmonary nodules in the anterior right middle lobe and superior segment left lower lobe, below PET resolution. No acute consolidative airspace disease or new significant pulmonary nodules. ABDOMEN/PELVIS No abnormal hypermetabolic activity within the liver, pancreas, adrenal glands, or spleen. No hypermetabolic lymph nodes in the abdomen or pelvis. The previously described hypermetabolic central mesenteric, left paraaortic and right inguinal lymph nodes demonstrate no residual hypermetabolism and are decreased in size. Stable marked distal colonic diverticulosis. Stable small fat containing umbilical hernia. SKELETON No focal hypermetabolic activity to  suggest skeletal metastasis. Re- demonstrated is relatively diffuse hyper metabolism in the bilateral shoulder girdles, left greater than right, involving the glenohumeral joints and acromioclavicular joints, with associated degenerative changes at the acromioclavicular and glenohumeral joints, without a discrete osseous mass on the CT, most suggestive of an inflammatory arthropathy such as CPPD. Stable healed deformity in the right pubic rami. Marked degenerative changes throughout the spine, bilateral sacroiliac joints and bilateral hip joints. There is FDG uptake in the superficial left antecubital fossa soft tissues related to mild extravasation at the IV injection site. IMPRESSION: 1. Complete metabolic response. No residual hypermetabolic lymphadenopathy. 2. Two stable tiny 3 mm pulmonary nodules, below PET resolution. 3. 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 4. Atherosclerosis, including three-vessel coronary artery disease. Please note that although the presence of coronary artery calcium documents the presence of coronary artery disease, the severity of this disease and any potential stenosis cannot be assessed on this non-gated CT examination. Electronically Signed   By: Ilona Sorrel M.D.   On: 09/05/2015 10:59    ASSESSMENT: Grade 1, at least stage III follicular lymphoma, no bone marrow biopsy was performed secondary to advanced age.  PLAN:    1. Follicular lymphoma: PET scan results from September 05, 2015 revealed a complete metabolic response.  Patient completed weekly Rituxan 4 on June 20, 2015.  No further intervention is needed. Patient will return to clinic in 3 months for laboratory work and further evaluation. Will reimage in 6 months. Patient's daughter expressed understanding and was in agreement with this plan. 2. Anemia: Mild. Repeat iron stores at next clinic visit and consider IV Feraheme. 3. Renal insufficiency: Patient's creatinine is approximately her baseline, monitor. 4. Shoulder pain: Bone scan and PET results previously reviewed revealing significant abnormalities, but not concerning for progressive lymphoma. Continue symptomatic treatment.  Patient expressed understanding and was in agreement with this plan. She also understands that She can call clinic at any time with any questions, concerns, or complaints.   Lloyd Huger, MD   09/15/2015 8:18 AM

## 2015-11-07 ENCOUNTER — Other Ambulatory Visit: Payer: Self-pay | Admitting: Nurse Practitioner

## 2015-12-06 ENCOUNTER — Inpatient Hospital Stay: Payer: Medicare Other | Admitting: Oncology

## 2015-12-06 ENCOUNTER — Inpatient Hospital Stay: Payer: Medicare Other | Attending: Oncology

## 2015-12-06 ENCOUNTER — Inpatient Hospital Stay: Payer: Medicare Other

## 2015-12-24 ENCOUNTER — Inpatient Hospital Stay: Payer: Medicare Other | Admitting: Oncology

## 2015-12-24 ENCOUNTER — Inpatient Hospital Stay: Payer: Medicare Other

## 2015-12-27 ENCOUNTER — Inpatient Hospital Stay: Payer: Medicare Other | Attending: Oncology

## 2015-12-27 ENCOUNTER — Inpatient Hospital Stay: Payer: Medicare Other | Admitting: Oncology

## 2016-03-13 ENCOUNTER — Inpatient Hospital Stay: Payer: Medicare Other | Attending: Oncology

## 2016-03-13 ENCOUNTER — Inpatient Hospital Stay (HOSPITAL_BASED_OUTPATIENT_CLINIC_OR_DEPARTMENT_OTHER): Payer: Medicare Other | Admitting: Oncology

## 2016-03-13 VITALS — BP 191/75 | HR 59 | Temp 98.1°F | Resp 20 | Wt 210.5 lb

## 2016-03-13 DIAGNOSIS — I10 Essential (primary) hypertension: Secondary | ICD-10-CM | POA: Diagnosis not present

## 2016-03-13 DIAGNOSIS — M1990 Unspecified osteoarthritis, unspecified site: Secondary | ICD-10-CM

## 2016-03-13 DIAGNOSIS — D649 Anemia, unspecified: Secondary | ICD-10-CM | POA: Diagnosis not present

## 2016-03-13 DIAGNOSIS — Z809 Family history of malignant neoplasm, unspecified: Secondary | ICD-10-CM | POA: Diagnosis not present

## 2016-03-13 DIAGNOSIS — R413 Other amnesia: Secondary | ICD-10-CM | POA: Diagnosis not present

## 2016-03-13 DIAGNOSIS — N2889 Other specified disorders of kidney and ureter: Secondary | ICD-10-CM

## 2016-03-13 DIAGNOSIS — R531 Weakness: Secondary | ICD-10-CM

## 2016-03-13 DIAGNOSIS — M25512 Pain in left shoulder: Secondary | ICD-10-CM | POA: Diagnosis not present

## 2016-03-13 DIAGNOSIS — K219 Gastro-esophageal reflux disease without esophagitis: Secondary | ICD-10-CM

## 2016-03-13 DIAGNOSIS — Z803 Family history of malignant neoplasm of breast: Secondary | ICD-10-CM | POA: Insufficient documentation

## 2016-03-13 DIAGNOSIS — M25511 Pain in right shoulder: Secondary | ICD-10-CM

## 2016-03-13 DIAGNOSIS — Z7982 Long term (current) use of aspirin: Secondary | ICD-10-CM | POA: Diagnosis not present

## 2016-03-13 DIAGNOSIS — C8200 Follicular lymphoma grade I, unspecified site: Secondary | ICD-10-CM

## 2016-03-13 DIAGNOSIS — R5383 Other fatigue: Secondary | ICD-10-CM | POA: Insufficient documentation

## 2016-03-13 DIAGNOSIS — Z79899 Other long term (current) drug therapy: Secondary | ICD-10-CM | POA: Insufficient documentation

## 2016-03-13 DIAGNOSIS — E7800 Pure hypercholesterolemia, unspecified: Secondary | ICD-10-CM

## 2016-03-13 DIAGNOSIS — C8290 Follicular lymphoma, unspecified, unspecified site: Secondary | ICD-10-CM

## 2016-03-13 DIAGNOSIS — Z859 Personal history of malignant neoplasm, unspecified: Secondary | ICD-10-CM | POA: Diagnosis not present

## 2016-03-13 DIAGNOSIS — R630 Anorexia: Secondary | ICD-10-CM | POA: Diagnosis not present

## 2016-03-13 LAB — CBC WITH DIFFERENTIAL/PLATELET
BASOS ABS: 0.1 10*3/uL (ref 0–0.1)
Basophils Relative: 1 %
EOS PCT: 1 %
Eosinophils Absolute: 0.1 10*3/uL (ref 0–0.7)
HCT: 26.2 % — ABNORMAL LOW (ref 35.0–47.0)
HEMOGLOBIN: 8.9 g/dL — AB (ref 12.0–16.0)
LYMPHS PCT: 21 %
Lymphs Abs: 1.4 10*3/uL (ref 1.0–3.6)
MCH: 30.9 pg (ref 26.0–34.0)
MCHC: 34 g/dL (ref 32.0–36.0)
MCV: 90.9 fL (ref 80.0–100.0)
Monocytes Absolute: 0.7 10*3/uL (ref 0.2–0.9)
Monocytes Relative: 11 %
NEUTROS PCT: 66 %
Neutro Abs: 4.5 10*3/uL (ref 1.4–6.5)
PLATELETS: 228 10*3/uL (ref 150–440)
RBC: 2.88 MIL/uL — AB (ref 3.80–5.20)
RDW: 15.6 % — ABNORMAL HIGH (ref 11.5–14.5)
WBC: 6.7 10*3/uL (ref 3.6–11.0)

## 2016-03-13 NOTE — Progress Notes (Signed)
Pt here for F/U care of Follicular Lymphoma. In a W/C for safety. Patient uses a cane to ambulate at home. Has some fatigue. Appetite is good, Poor memory at times. Denies night sweats or fevers. Some pain in Left shoulder, aching. Occassional  headaches. Appetite is good. Bowels are normal with a stool softener daily. Has a tremor or palsy of hands. Hard of hearing.

## 2016-03-15 ENCOUNTER — Other Ambulatory Visit: Payer: Self-pay | Admitting: Oncology

## 2016-03-24 ENCOUNTER — Other Ambulatory Visit: Payer: Self-pay | Admitting: Oncology

## 2016-03-24 DIAGNOSIS — C8290 Follicular lymphoma, unspecified, unspecified site: Secondary | ICD-10-CM

## 2016-03-24 NOTE — Progress Notes (Signed)
[facility]  [phone] [phone]) [phone]  ID: Barbara Chavez OB: [birth year]  MR#: [id number]  FFM#:[id number]  Patient Care Team: Kirk Ruths, MD as PCP - General (Internal Medicine)  CHIEF COMPLAINT:  Chief Complaint  Patient presents with  . Follow-up    Follicular Lymphoma    INTERVAL HISTORY: Patient last evaluated in clinic in October 2016. She returns today for routine evaluation. She continues to have persistent weakness and fatigue. She continues to have bilateral shoulder pain. She  Admits to a poor memory at times.  She denies any fevers. She has no neurologic complaints. She denies any chest pain or shortness of breath. She has a poor appetite. Patient offers no further specific complaints today.  REVIEW OF SYSTEMS:   Review of Systems  Constitutional: Positive for malaise/fatigue. Negative for fever and weight loss.  Respiratory: Negative.   Cardiovascular: Negative.   Gastrointestinal: Negative for nausea and vomiting.  Genitourinary: Negative.   Musculoskeletal: Positive for joint pain.  Neurological: Positive for weakness.  Psychiatric/Behavioral: Positive for memory loss.    As per HPI. Otherwise, a complete review of systems is negatve.  PAST MEDICAL HISTORY: Past Medical History  Diagnosis Date  . Hypertension   . Reflux   . Osteoarthritis   . Hypercholesterolemia   . Cancer     PAST SURGICAL HISTORY: Past Surgical History  Procedure Laterality Date  . Cataract extraction    . Knee arthroscopy      FAMILY HISTORY Family History  Problem Relation Age of Onset  . Cancer Sister   . Breast cancer Sister        ADVANCED DIRECTIVES:    HEALTH MAINTENANCE: Social History  Substance Use Topics  . Smoking status: Never Smoker   . Smokeless tobacco: Never Used  . Alcohol Use: No     Colonoscopy:  PAP:  Bone density:  Lipid panel:  No Known Allergies  Current Outpatient Prescriptions    Medication Sig Dispense Refill  . aspirin EC 81 MG tablet Take 81 mg by mouth daily.    Marland Kitchen atorvastatin (LIPITOR) 40 MG tablet Take 40 mg by mouth at bedtime.    . busPIRone (BUSPAR) 15 MG tablet Take 30 mg by mouth 2 (two) times daily.    . cetirizine (ZYRTEC) 10 MG tablet Take 10 mg by mouth daily as needed for allergies.    . citalopram (CELEXA) 20 MG tablet Take 20 mg by mouth daily.    . cloNIDine (CATAPRES) 0.2 MG tablet Take 0.2 mg by mouth 2 (two) times daily.    Marland Kitchen docusate sodium (COLACE) 100 MG capsule Take 100 mg by mouth every other day.    . donepezil (ARICEPT) 10 MG tablet Take 10 mg by mouth at bedtime.    . Fluticasone Propionate, Inhal, (FLOVENT DISKUS) 100 MCG/BLIST AEPB Inhale 1 puff into the lungs 2 (two) times daily as needed (for shortness of breath).    . labetalol (NORMODYNE) 300 MG tablet Take 600 mg by mouth 2 (two) times daily.    . pantoprazole (PROTONIX) 20 MG tablet Take 20 mg by mouth daily.    . potassium gluconate 595 MG TABS tablet Take 595 mg by mouth daily.    Marland Kitchen torsemide (DEMADEX) 20 MG tablet Take 20 mg by mouth daily.    . traMADol (ULTRAM) 50 MG tablet Take 50-100 mg by mouth every 8 (eight) hours as needed for moderate pain.    . traZODone (DESYREL) 50 MG tablet Take by  mouth.    . vitamin B-12 (CYANOCOBALAMIN) 1000 MCG tablet Take 1,000 mcg by mouth daily.    Marland Kitchen omeprazole (PRILOSEC) 20 MG capsule Reported on 03/13/2016    . prochlorperazine (COMPAZINE) 10 MG tablet Take 1 tablet (10 mg total) by mouth every 6 (six) hours as needed for nausea or vomiting. (Patient not taking: Reported on 03/13/2016) 30 tablet 0  . promethazine (PHENERGAN) 12.5 MG tablet 1-2 tablets evey 6 hours prn for nausea and vomiting (Patient not taking: Reported on 09/05/2015) 60 tablet 0   No current facility-administered medications for this visit.    OBJECTIVE: Filed Vitals:   03/13/16 1556  BP: 191/75  Pulse: 59  Temp: 98.1 F (36.7 C)  Resp: 20     Body mass index is  32.97 kg/(m^2).    ECOG FS:1 - Symptomatic but completely ambulatory  General: Well-developed, well-nourished, no acute distress. Eyes: Pink conjunctiva, anicteric sclera. Lungs: Clear to auscultation bilaterally. Heart: Regular rate and rhythm. No rubs, murmurs, or gallops. Abdomen: Soft, nontender, nondistended. No organomegaly noted, normoactive bowel sounds. Musculoskeletal: No edema, cyanosis, or clubbing. Neuro: Alert, answering all questions appropriately. Cranial nerves grossly intact. Skin: No rashes or petechiae noted. Psych: Normal affect. Lymphatics: No cervical, calvicular, axillary or inguinal LAD.   LAB RESULTS:  Lab Results  Component Value Date   NA 138 09/03/2015   K 3.9 09/03/2015   CL 99* 09/03/2015   CO2 32 09/03/2015   GLUCOSE 110* 09/03/2015   BUN 28* 09/03/2015   CREATININE 1.17* 09/03/2015   CALCIUM 8.5* 09/03/2015   PROT 6.6 07/25/2015   ALBUMIN 3.2* 07/25/2015   AST 24 07/25/2015   ALT 17 07/25/2015   ALKPHOS 130* 07/25/2015   BILITOT 0.7 07/25/2015   GFRNONAA 41* 09/03/2015   GFRAA 47* 09/03/2015    Lab Results  Component Value Date   WBC 6.7 03/13/2016   NEUTROABS 4.5 03/13/2016   HGB 8.9* 03/13/2016   HCT 26.2* 03/13/2016   MCV 90.9 03/13/2016   PLT 228 03/13/2016     STUDIES: No results found.  ASSESSMENT: Grade 1, at least stage III follicular lymphoma, no bone marrow biopsy was performed secondary to advanced age.  PLAN:    1. Follicular lymphoma: PET scan results from September 05, 2015 revealed a complete metabolic response.  Patient completed weekly Rituxan 4 on June 20, 2015.  No further intervention is needed. Patient will have a restaging CT scan in the next 1-2 weeks then return to clinic in 6 months for further evaluation. 2. Anemia: Mild. Repeat iron stores at next clinic visit and consider IV Feraheme. 3. Renal insufficiency: Patient's creatinine is approximately her baseline, monitor. 4. Shoulder pain: Bone scan and  PET results previously reviewed revealing significant abnormalities, but not concerning for progressive lymphoma. Continue symptomatic treatment.  Patient expressed understanding and was in agreement with this plan. She also understands that She can call clinic at any time with any questions, concerns, or complaints.   Lloyd Huger, MD   03/24/2016 9:53 AM

## 2016-04-03 ENCOUNTER — Ambulatory Visit: Admission: RE | Admit: 2016-04-03 | Payer: Medicare Other | Source: Ambulatory Visit

## 2016-05-26 ENCOUNTER — Other Ambulatory Visit: Payer: Self-pay | Admitting: *Deleted

## 2016-05-26 ENCOUNTER — Other Ambulatory Visit: Payer: Self-pay | Admitting: Oncology

## 2016-05-26 ENCOUNTER — Ambulatory Visit
Admission: RE | Admit: 2016-05-26 | Discharge: 2016-05-26 | Disposition: A | Discharge status: Home / Self Care | Payer: Medicare Other | Source: Ambulatory Visit | Attending: Oncology | Admitting: Oncology

## 2016-05-26 DIAGNOSIS — R918 Other nonspecific abnormal finding of lung field: Secondary | ICD-10-CM | POA: Insufficient documentation

## 2016-05-26 DIAGNOSIS — I517 Cardiomegaly: Secondary | ICD-10-CM | POA: Insufficient documentation

## 2016-05-26 DIAGNOSIS — I2510 Atherosclerotic heart disease of native coronary artery without angina pectoris: Secondary | ICD-10-CM | POA: Insufficient documentation

## 2016-05-26 DIAGNOSIS — C8290 Follicular lymphoma, unspecified, unspecified site: Secondary | ICD-10-CM

## 2016-05-26 DIAGNOSIS — I700 Atherosclerosis of aorta: Secondary | ICD-10-CM | POA: Insufficient documentation

## 2016-05-26 DIAGNOSIS — Z1231 Encounter for screening mammogram for malignant neoplasm of breast: Secondary | ICD-10-CM

## 2016-05-26 DIAGNOSIS — C8280 Other types of follicular lymphoma, unspecified site: Secondary | ICD-10-CM | POA: Insufficient documentation

## 2016-05-27 ENCOUNTER — Other Ambulatory Visit: Payer: Self-pay | Admitting: Oncology

## 2016-05-27 ENCOUNTER — Ambulatory Visit
Admission: RE | Admit: 2016-05-27 | Discharge: 2016-05-27 | Disposition: A | Discharge status: Home / Self Care | Payer: Medicare Other | Source: Ambulatory Visit | Attending: Oncology | Admitting: Oncology

## 2016-05-27 DIAGNOSIS — Z1231 Encounter for screening mammogram for malignant neoplasm of breast: Secondary | ICD-10-CM

## 2016-05-27 LAB — POCT I-STAT CREATININE: Creatinine, Ser: 2 mg/dL — ABNORMAL HIGH (ref 0.44–1.00)

## 2016-06-10 ENCOUNTER — Telehealth: Payer: Self-pay

## 2016-06-10 NOTE — Telephone Encounter (Signed)
Left vm for patients daughter regarding results.

## 2016-06-10 NOTE — Telephone Encounter (Signed)
No evidence of disease. Keep follow-up appointments as scheduled.

## 2016-06-10 NOTE — Telephone Encounter (Signed)
Patients daughter would like results from the CT scan  Done on 7/17

## 2016-06-11 ENCOUNTER — Telehealth: Payer: Self-pay

## 2016-06-11 NOTE — Telephone Encounter (Signed)
Daughter contacted, informed her that CT results were negative and that her mother should keep scheduled follow up appointments

## 2016-06-11 NOTE — Telephone Encounter (Signed)
Patient had CT on 7/17 daughter Mable Fill would like the results, please call her at listed number

## 2016-06-21 ENCOUNTER — Ambulatory Visit
Admission: EM | Admit: 2016-06-21 | Discharge: 2016-06-21 | Disposition: A | Discharge status: Home / Self Care | Payer: Medicare Other | Attending: Emergency Medicine | Admitting: Emergency Medicine

## 2016-06-21 ENCOUNTER — Ambulatory Visit: Payer: Medicare Other

## 2016-06-21 DIAGNOSIS — J189 Pneumonia, unspecified organism: Secondary | ICD-10-CM | POA: Insufficient documentation

## 2016-06-21 DIAGNOSIS — R05 Cough: Secondary | ICD-10-CM | POA: Diagnosis present

## 2016-06-21 DIAGNOSIS — C8290 Follicular lymphoma, unspecified, unspecified site: Secondary | ICD-10-CM | POA: Diagnosis not present

## 2016-06-21 DIAGNOSIS — M1990 Unspecified osteoarthritis, unspecified site: Secondary | ICD-10-CM | POA: Insufficient documentation

## 2016-06-21 DIAGNOSIS — Z7982 Long term (current) use of aspirin: Secondary | ICD-10-CM | POA: Insufficient documentation

## 2016-06-21 DIAGNOSIS — F0390 Unspecified dementia without behavioral disturbance: Secondary | ICD-10-CM | POA: Insufficient documentation

## 2016-06-21 DIAGNOSIS — Z79899 Other long term (current) drug therapy: Secondary | ICD-10-CM | POA: Insufficient documentation

## 2016-06-21 DIAGNOSIS — I10 Essential (primary) hypertension: Secondary | ICD-10-CM | POA: Insufficient documentation

## 2016-06-21 DIAGNOSIS — K219 Gastro-esophageal reflux disease without esophagitis: Secondary | ICD-10-CM | POA: Insufficient documentation

## 2016-06-21 HISTORY — DX: Non-Hodgkin lymphoma, unspecified, unspecified site: C85.90

## 2016-06-21 HISTORY — DX: Unspecified dementia, unspecified severity, without behavioral disturbance, psychotic disturbance, mood disturbance, and anxiety: F03.90

## 2016-06-21 MED ORDER — AZITHROMYCIN 250 MG PO TABS: 250.0000 mg | ORAL_TABLET | Freq: Every day | ORAL | 0 refills | Status: AC

## 2016-06-21 NOTE — Discharge Instructions (Signed)
Call early next week (Monday) to make an appointment with your primary care to follow up

## 2016-06-21 NOTE — ED Provider Notes (Signed)
CSN: [id number]     Arrival date & time 06/21/16  1428 History   First MD Initiated Contact with Patient 06/21/16 1504     Chief Complaint  Patient presents with  . Cough   (Consider location/radiation/quality/duration/timing/severity/associated sxs/prior Treatment) Mrs. Houp is a 82 y/o female with history of hypertension, mild dementia, lymphoma in remission ht in daughter for cough. Patient lives with daughter and have occasional home health services by [facility]. She is not in hospice care. Patient have had this cough for 3 days. Cough is described as deep, congestive and productive.  Patient also exhibits running nose and watery eyes but has underlying allergies. Patient and daughter denies fever and denies urinary symptoms. Daughter denies changes from her baseline mental status BP noted to be elevated; daughter reports that she is compliance and is taking all medication as prescribed; daughter administers all her home medications.       Past Medical History:  Diagnosis Date  . Cancer ([place])   . Dementia   . Hypercholesterolemia   . Hypertension   . Lymphoma ([place])   . Osteoarthritis   . Reflux    Past Surgical History:  Procedure Laterality Date  . CATARACT EXTRACTION    . KNEE ARTHROSCOPY     Family History  Problem Relation Age of Onset  . Cancer Sister   . Breast cancer Sister    Social History  Substance Use Topics  . Smoking status: Never Smoker  . Smokeless tobacco: Never Used  . Alcohol use No   OB History    No data available     Review of Systems  Constitutional: Negative for appetite change, chills, fatigue and fever.  HENT: Positive for rhinorrhea. Negative for congestion, ear pain and sore throat.   Eyes: Negative for discharge and redness.       Positive for watery eyes  Respiratory: Positive for cough. Negative for shortness of breath and wheezing.   Cardiovascular: Negative for chest pain, palpitations and leg swelling.   Gastrointestinal: Negative for abdominal pain, diarrhea, nausea and vomiting.  Genitourinary: Negative for dysuria, frequency and urgency.  Musculoskeletal: Negative for arthralgias and myalgias.  Neurological: Negative for headaches.    Allergies  Review of patient's allergies indicates no known allergies.  Home Medications   Prior to Admission medications   Medication Sig Start Date End Date Taking? Authorizing Provider  aspirin EC 81 MG tablet Take 81 mg by mouth daily.   Yes Historical Provider, MD  atorvastatin (LIPITOR) 40 MG tablet Take 40 mg by mouth at bedtime.   Yes Historical Provider, MD  busPIRone (BUSPAR) 15 MG tablet Take 30 mg by mouth 2 (two) times daily.   Yes Historical Provider, MD  cetirizine (ZYRTEC) 10 MG tablet Take 10 mg by mouth daily as needed for allergies.   Yes Historical Provider, MD  citalopram (CELEXA) 20 MG tablet Take 20 mg by mouth daily.   Yes Historical Provider, MD  cloNIDine (CATAPRES) 0.2 MG tablet Take 0.2 mg by mouth 2 (two) times daily.   Yes Historical Provider, MD  donepezil (ARICEPT) 10 MG tablet Take 10 mg by mouth at bedtime.   Yes Historical Provider, MD  Fluticasone Propionate, Inhal, (FLOVENT DISKUS) 100 MCG/BLIST AEPB Inhale 1 puff into the lungs 2 (two) times daily as needed (for shortness of breath).   Yes Historical Provider, MD  labetalol (NORMODYNE) 300 MG tablet Take 600 mg by mouth 2 (two) times daily.   Yes Historical Provider, MD  omeprazole (  PRILOSEC) 20 MG capsule Reported on 03/13/2016 07/23/15  Yes Historical Provider, MD  pantoprazole (PROTONIX) 20 MG tablet Take 20 mg by mouth daily.   Yes Historical Provider, MD  torsemide (DEMADEX) 20 MG tablet Take 20 mg by mouth daily.   Yes Historical Provider, MD  traMADol (ULTRAM) 50 MG tablet Take 50-100 mg by mouth every 8 (eight) hours as needed for moderate pain.   Yes Historical Provider, MD  traZODone (DESYREL) 50 MG tablet Take by mouth. 07/26/15 07/25/16 Yes Historical Provider,  MD  vitamin B-12 (CYANOCOBALAMIN) 1000 MCG tablet Take 1,000 mcg by mouth daily.   Yes Historical Provider, MD  azithromycin (ZITHROMAX) 250 MG tablet Take 1 tablet (250 mg total) by mouth daily. Take first 2 tablets together, then 1 every day until finished. 06/21/16 06/26/16  Barry Dienes, NP  docusate sodium (COLACE) 100 MG capsule Take 100 mg by mouth every other day.    Historical Provider, MD  potassium gluconate 595 MG TABS tablet Take 595 mg by mouth daily.    Historical Provider, MD  prochlorperazine (COMPAZINE) 10 MG tablet Take 1 tablet (10 mg total) by mouth every 6 (six) hours as needed for nausea or vomiting. 06/26/15   Lloyd Huger, MD  promethazine (PHENERGAN) 12.5 MG tablet 1-2 tablets evey 6 hours prn for nausea and vomiting Patient not taking: Reported on 09/05/2015 06/25/15   Leia Alf, MD   Meds Ordered and Administered this Visit  Medications - No data to display  BP (!) 170/59 (BP Location: Right Arm)   Pulse 60   Temp 97.5 F (36.4 C) (Tympanic)   Resp 20   SpO2 100%  No data found.   Physical Exam  Constitutional: She appears well-developed and well-nourished. No distress.  HENT:  Head: Normocephalic and atraumatic.  Right Ear: External ear normal.  Left Ear: External ear normal.  Nose: Nose normal.  Mouth/Throat: Oropharynx is clear and moist. No oropharyngeal exudate.  Eyes: Conjunctivae and EOM are normal. Pupils are equal, round, and reactive to light. Right eye exhibits no discharge. Left eye exhibits no discharge.  Neck: Normal range of motion. Neck supple.  Cardiovascular: Normal rate, regular rhythm and intact distal pulses.   Murmur heard. Grade II systolic murmur present most prominent over pulmonic area  Pulmonary/Chest: Effort normal and breath sounds normal. She has no wheezes.  Questionable crackle noted at left middle field  Abdominal: Soft. Bowel sounds are normal.  Lymphadenopathy:    She has no cervical adenopathy.  Neurological:  She is alert.  At baseline   Skin: Skin is warm and dry. She is not diaphoretic.  Psychiatric: She has a normal mood and affect. Her behavior is normal.    Urgent Care Course   Clinical Course    Procedures (including critical care time)  Labs Review Labs Reviewed - No data to display  Imaging Review Dg Chest 2 View  Result Date: 06/21/2016 CLINICAL DATA:  82 year old female with cough. Initial encounter. Follicular lymphoma. EXAM: CHEST  2 VIEW COMPARISON:  Chest CT 05/26/2016 and earlier. FINDINGS: The patient arms are not raised on the lateral views limiting their utility. Calcified aortic atherosclerosis. Stable cardiomegaly and mediastinal contours. No pneumothorax. No pleural effusion. No pulmonary edema suspected. Chronic mild increased interstitial markings. Subtle patchy increased opacity in the left mid lung (arrows). No other confluent pulmonary opacity identified. No acute osseous abnormality identified. IMPRESSION: 1. New patchy left mid lung opacity suspicious for developing pneumonia in this setting. No pleural effusion. 2.  Stable cardiomegaly.  Calcified aortic atherosclerosis. Electronically Signed   By: Genevie Ann M.D.   On: 06/21/2016 15:42       MDM   1. CAP (community acquired pneumonia)    Msr. Fayson is a 82 y/o female with history of mild dementia, brought in by daughter for cough. She exhibits crackles in her left lung; chest xray with left mid lung opacity suspicious for developing pneumonia. Patient has underlying dementia but not any worst compared to her baseline, she does not have tachypnea, she is not hypotensive. She is also afebrile. Patient is in good and stable condition, which I feel comfortable to discharge her home to the care of her daughter, who lives with her. Daughter instructed to call patient's PCP on Monday to schedule an appointment no later than Tuesday to follow up.     Barry Dienes, NP 06/21/16 1609

## 2016-06-21 NOTE — ED Triage Notes (Signed)
Daughter reports 3 days of tight cough, worse at night, non productive no fever. Seen by Hospice nurse yesterday.

## 2016-07-22 ENCOUNTER — Other Ambulatory Visit: Payer: Self-pay | Admitting: *Deleted

## 2016-07-22 DIAGNOSIS — D649 Anemia, unspecified: Secondary | ICD-10-CM

## 2016-07-23 ENCOUNTER — Emergency Department: Payer: Medicare Other

## 2016-07-23 ENCOUNTER — Emergency Department
Admission: EM | Admit: 2016-07-23 | Discharge: 2016-07-23 | Disposition: A | Discharge status: Home / Self Care | Payer: Medicare Other | Attending: Emergency Medicine | Admitting: Emergency Medicine

## 2016-07-23 DIAGNOSIS — Z79899 Other long term (current) drug therapy: Secondary | ICD-10-CM | POA: Diagnosis not present

## 2016-07-23 DIAGNOSIS — Z7982 Long term (current) use of aspirin: Secondary | ICD-10-CM | POA: Insufficient documentation

## 2016-07-23 DIAGNOSIS — I10 Essential (primary) hypertension: Secondary | ICD-10-CM | POA: Diagnosis not present

## 2016-07-23 DIAGNOSIS — F0390 Unspecified dementia without behavioral disturbance: Secondary | ICD-10-CM | POA: Diagnosis not present

## 2016-07-23 DIAGNOSIS — R109 Unspecified abdominal pain: Secondary | ICD-10-CM | POA: Diagnosis present

## 2016-07-23 DIAGNOSIS — R410 Disorientation, unspecified: Secondary | ICD-10-CM

## 2016-07-23 DIAGNOSIS — E876 Hypokalemia: Secondary | ICD-10-CM | POA: Insufficient documentation

## 2016-07-23 DIAGNOSIS — Z85830 Personal history of malignant neoplasm of bone: Secondary | ICD-10-CM | POA: Insufficient documentation

## 2016-07-23 DIAGNOSIS — I671 Cerebral aneurysm, nonruptured: Secondary | ICD-10-CM | POA: Insufficient documentation

## 2016-07-23 LAB — COMPREHENSIVE METABOLIC PANEL
ALBUMIN: 3.8 g/dL (ref 3.5–5.0)
ALK PHOS: 82 U/L (ref 38–126)
ALT: 15 U/L (ref 14–54)
AST: 22 U/L (ref 15–41)
Anion gap: 12 (ref 5–15)
BILIRUBIN TOTAL: 0.6 mg/dL (ref 0.3–1.2)
BUN: 160 mg/dL — AB (ref 6–20)
CO2: 35 mmol/L — AB (ref 22–32)
Calcium: 9.8 mg/dL (ref 8.9–10.3)
Chloride: 91 mmol/L — ABNORMAL LOW (ref 101–111)
Creatinine, Ser: 2.15 mg/dL — ABNORMAL HIGH (ref 0.44–1.00)
GFR calc Af Amer: 23 mL/min — ABNORMAL LOW (ref >60)
GFR calc non Af Amer: 19 mL/min — ABNORMAL LOW (ref >60)
GLUCOSE: 122 mg/dL — AB (ref 65–99)
POTASSIUM: 3.1 mmol/L — AB (ref 3.5–5.1)
SODIUM: 138 mmol/L (ref 135–145)
TOTAL PROTEIN: 7.3 g/dL (ref 6.5–8.1)

## 2016-07-23 LAB — TROPONIN I

## 2016-07-23 LAB — CBC
HEMATOCRIT: 29.9 % — AB (ref 35.0–47.0)
Hemoglobin: 10.1 g/dL — ABNORMAL LOW (ref 12.0–16.0)
MCH: 30.4 pg (ref 26.0–34.0)
MCHC: 33.8 g/dL (ref 32.0–36.0)
MCV: 89.9 fL (ref 80.0–100.0)
Platelets: 249 10*3/uL (ref 150–440)
RBC: 3.33 MIL/uL — ABNORMAL LOW (ref 3.80–5.20)
RDW: 15 % — AB (ref 11.5–14.5)
WBC: 8.2 10*3/uL (ref 3.6–11.0)

## 2016-07-23 LAB — URINALYSIS COMPLETE WITH MICROSCOPIC (ARMC ONLY)
BILIRUBIN URINE: NEGATIVE
GLUCOSE, UA: NEGATIVE mg/dL
HGB URINE DIPSTICK: NEGATIVE
Ketones, ur: NEGATIVE mg/dL
NITRITE: NEGATIVE
Protein, ur: NEGATIVE mg/dL
Specific Gravity, Urine: 1.011 (ref 1.005–1.030)
pH: 5 (ref 5.0–8.0)

## 2016-07-23 LAB — LIPASE, BLOOD: Lipase: 42 U/L (ref 11–51)

## 2016-07-23 MED ORDER — POTASSIUM CHLORIDE CRYS ER 20 MEQ PO TBCR
40.0000 meq | EXTENDED_RELEASE_TABLET | Freq: Once | ORAL | Status: AC
  Administered 2016-07-23: 40 meq via ORAL
  Filled 2016-07-23: qty 2

## 2016-07-23 MED ORDER — POTASSIUM CHLORIDE CRYS ER 20 MEQ PO TBCR: 20.0000 meq | EXTENDED_RELEASE_TABLET | Freq: Every day | ORAL | 0 refills | Status: DC

## 2016-07-23 MED ORDER — FOSFOMYCIN TROMETHAMINE 3 G PO PACK
3.0000 g | PACK | Freq: Once | ORAL | Status: AC
  Administered 2016-07-23: 3 g via ORAL
  Filled 2016-07-23: qty 3

## 2016-07-23 NOTE — ED Notes (Signed)
Pts daughter reports pt has had increased falls over the past week with three in the past two days. Pt reports having hit head three days ago. No trauma noted to the area.

## 2016-07-23 NOTE — Discharge Instructions (Signed)
As we discussed, your workup today was reassuring.  Though we do not know exactly what is causing your symptoms, it appears that you have no emergent medical condition at this time are safe to go home and follow up as recommended in this paperwork.  Of note, your potassium level was slightly low, and we gave a supplement in the ED and a prescription for a daily supplement for the next month.  We recommend you follow up as planned for repeat blood work.  Additionally, we gave a dose of medication called fosfomycin which treats with a single dose most mild urinary tract infection.  We treated you because of a few white blood cells in your urine, but there was not an obvious sign of infection; your urine culture is pending and may offer additional information.  Please return immediately to the Emergency Department if you develop any new or worsening symptoms that concern you.

## 2016-07-23 NOTE — ED Provider Notes (Signed)
[facility] Emergency Department Provider Note  ____________________________________________   First MD Initiated Contact with Patient 07/23/16 1744     (approximate)  I have reviewed the triage vital signs and the nursing notes.   HISTORY  Chief Complaint Fall and Abdominal Pain  Level V caveat: The patient has advanced dementia and is unable to provide a reliable history or review of systems.  HPI Barbara Chavez is a 82 y.o. female with a history of dementia and who had pneumonia a couple months ago and was treated as an outpatient and presents for several recent falls, worsening mental status and increased confusion, and occasional complaint of chest pain as well as a persistent cough.She lives independently but her daughter is close by and spends "all my time there".  She states that she has been worried about her mother for several days with increased confusion as well as occasional complaints of chest pain and persistent cough.  She was diagnosed as an outpatient with pneumonia one month ago and completed an outpatient course of treatment.  She reportedly had a follow-up x-ray but did not hear back from her primary care doctor so she assumes it was better.  She has not had any fevers of which the daughter is aware and is not working hard to breathe.  The last time she complained of chest pain was last night as she was going to sleep but she did not indicate where it was located.  She has had urinary tract infections in the past but currently has no urinary symptoms.  She has had falls in the past and she has had several recent ones including one several days ago where she struck the back of her head but did not lose consciousness and has not complained of headache or neck pain since that time.  The daughter reports that her symptoms are moderate in severity, nothing is making them better nor worse, but they do seem to be gradually worsening over time.  Past  Medical History:  Diagnosis Date  . Cancer ([place])   . Dementia   . Hypercholesterolemia   . Hypertension   . Lymphoma ([place])   . Osteoarthritis   . Reflux     Patient Active Problem List   Diagnosis Date Noted  . Hyponatremia 06/27/2015  . Follicular non-Hodgkin's lymphoma ([place]) 05/24/2015  . Adenopathy 05/03/2015  . Anemia 04/20/2015  . 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 Humerus lesion, right 04/20/2015    Past Surgical History:  Procedure Laterality Date  . CATARACT EXTRACTION    . KNEE ARTHROSCOPY      Prior to Admission medications   Medication Sig Start Date End Date Taking? Authorizing Provider  aspirin EC 81 MG tablet Take 81 mg by mouth daily.    Historical Provider, MD  atorvastatin (LIPITOR) 40 MG tablet Take 40 mg by mouth at bedtime.    Historical Provider, MD  busPIRone (BUSPAR) 15 MG tablet Take 30 mg by mouth 2 (two) times daily.    Historical Provider, MD  cetirizine (ZYRTEC) 10 MG tablet Take 10 mg by mouth daily as needed for allergies.    Historical Provider, MD  citalopram (CELEXA) 20 MG tablet Take 20 mg by mouth daily.    Historical Provider, MD  cloNIDine (CATAPRES) 0.2 MG tablet Take 0.2 mg by mouth 2 (two) times daily.    Historical Provider, MD  docusate sodium (COLACE) 100 MG capsule Take 100 mg by mouth every other day.  Historical Provider, MD  donepezil (ARICEPT) 10 MG tablet Take 10 mg by mouth at bedtime.    Historical Provider, MD  Fluticasone Propionate, Inhal, (FLOVENT DISKUS) 100 MCG/BLIST AEPB Inhale 1 puff into the lungs 2 (two) times daily as needed (for shortness of breath).    Historical Provider, MD  labetalol (NORMODYNE) 300 MG tablet Take 600 mg by mouth 2 (two) times daily.    Historical Provider, MD  omeprazole (PRILOSEC) 20 MG capsule Reported on 03/13/2016 07/23/15   Historical Provider, MD  pantoprazole (PROTONIX) 20 MG tablet Take 20 mg by mouth daily.    Historical Provider, MD  potassium chloride SA (KLOR-CON M20) 20 MEQ  tablet Take 1 tablet (20 mEq total) by mouth daily. 07/23/16   Hinda Kehr, MD  potassium gluconate 595 MG TABS tablet Take 595 mg by mouth daily.    Historical Provider, MD  prochlorperazine (COMPAZINE) 10 MG tablet Take 1 tablet (10 mg total) by mouth every 6 (six) hours as needed for nausea or vomiting. 06/26/15   Lloyd Huger, MD  promethazine (PHENERGAN) 12.5 MG tablet 1-2 tablets evey 6 hours prn for nausea and vomiting Patient not taking: Reported on 09/05/2015 06/25/15   Leia Alf, MD  torsemide (DEMADEX) 20 MG tablet Take 20 mg by mouth daily.    Historical Provider, MD  traMADol (ULTRAM) 50 MG tablet Take 50-100 mg by mouth every 8 (eight) hours as needed for moderate pain.    Historical Provider, MD  traZODone (DESYREL) 50 MG tablet Take by mouth. 07/26/15 07/25/16  Historical Provider, MD  vitamin B-12 (CYANOCOBALAMIN) 1000 MCG tablet Take 1,000 mcg by mouth daily.    Historical Provider, MD    Allergies Review of patient's allergies indicates no known allergies.  Family History  Problem Relation Age of Onset  . Cancer Sister   . Breast cancer Sister     Social History Social History  Substance Use Topics  . Smoking status: Never Smoker  . Smokeless tobacco: Never Used  . Alcohol use No    Review of Systems Constitutional: No fever/chills Eyes: No visual changes. ENT: No sore throat. Cardiovascular: +chest pain. Respiratory: Denies shortness of breath.  Frequent cough. Gastrointestinal: No abdominal pain.  No nausea, no vomiting.  No diarrhea.  No constipation. Genitourinary: Negative for dysuria. Musculoskeletal: Negative for back pain.  Several recent falls. Skin: Negative for rash. Neurological: Negative for headaches, focal weakness or numbness.  10-point ROS otherwise negative.  ____________________________________________   PHYSICAL EXAM:  VITAL SIGNS: ED Triage Vitals  Enc Vitals Group     BP 07/23/16 1413 125/79     Pulse Rate 07/23/16  1413 65     Resp 07/23/16 1413 18     Temp 07/23/16 1413 98.2 F (36.8 C)     Temp Source 07/23/16 1413 Oral     SpO2 07/23/16 1413 98 %     Weight 07/23/16 1414 200 lb (90.7 kg)     Height 07/23/16 1414 5\' 1"  (1.549 m)     Head Circumference --      Peak Flow --      Pain Score --      Pain Loc --      Pain Edu? --      Excl. in [place]? --     Constitutional: Alert and oriented to person and daughter. Generally well appearing and in no acute distress. Eyes: Conjunctivae are normal. PERRL. EOMI. Head: Atraumatic. Nose: No congestion/rhinnorhea. Mouth/Throat: Mucous membranes are moist.  Oropharynx  non-erythematous. Neck: No stridor.  No meningeal signs.  No cervical spine tenderness to palpation. Cardiovascular: Normal rate, regular rhythm. Good peripheral circulation. Grossly normal heart sounds. Respiratory: Normal respiratory effort.  No retractions. Lungs CTAB. Gastrointestinal: Soft and nontender. No distention.  Musculoskeletal: No lower extremity tenderness nor edema. No gross deformities of extremities. Neurologic:  Normal speech and language. No gross focal neurologic deficits are appreciated.  Skin:  Skin is warm, dry and intact. No rash noted.   ____________________________________________   LABS (all labs ordered are listed, but only abnormal results are displayed)  Labs Reviewed  COMPREHENSIVE METABOLIC PANEL - Abnormal; Notable for the following:       Result Value   Potassium 3.1 (*)    Chloride 91 (*)    CO2 35 (*)    Glucose, Bld 122 (*)    BUN 160 (*)    Creatinine, Ser 2.15 (*)    GFR calc non Af Amer 19 (*)    GFR calc Af Amer 23 (*)    All other components within normal limits  CBC - Abnormal; Notable for the following:    RBC 3.33 (*)    Hemoglobin 10.1 (*)    HCT 29.9 (*)    RDW 15.0 (*)    All other components within normal limits  URINALYSIS COMPLETEWITH MICROSCOPIC ([facility] ONLY) - Abnormal; Notable for the following:    Color, Urine YELLOW  (*)    APPearance CLEAR (*)    Leukocytes, UA TRACE (*)    Bacteria, UA RARE (*)    Squamous Epithelial / LPF 0-5 (*)    All other components within normal limits  URINE CULTURE  LIPASE, BLOOD  TROPONIN I   ____________________________________________  EKG  ED ECG REPORT I, Damaris Geers, the attending physician, personally viewed and interpreted this ECG.  Date: 07/23/2016 EKG Time: 17:30 Rate: 60 Rhythm: normal sinus rhythm with rare PVC QRS Axis: normal Intervals: Incomplete left bundle branch block ST/T Wave abnormalities: Non-specific ST segment / T-wave changes, but no evidence of acute ischemia. Conduction Disturbances: none Narrative Interpretation: unremarkable  ____________________________________________  RADIOLOGY   Dg Chest 2 View  Result Date: 07/23/2016 CLINICAL DATA:  Worsening cough for several days. Non-Hodgkin's lymphoma. Altered mental status. EXAM: CHEST  2 VIEW COMPARISON:  06/21/2016 FINDINGS: The heart size and mediastinal contours are within normal limits. Both lungs are clear. No evidence of pneumothorax or pleural effusion. IMPRESSION: No active cardiopulmonary disease. Electronically Signed   By: Earle Gell M.D.   On: 07/23/2016 19:07   Ct Head Wo Contrast  Result Date: 07/23/2016 CLINICAL DATA:  Increase falls over the past week with 3 in the past 2 days. Patient hit head 3 days ago. EXAM: CT HEAD WITHOUT CONTRAST TECHNIQUE: Contiguous axial images were obtained from the base of the skull through the vertex without intravenous contrast. 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 There is no evidence for acute hemorrhage, hydrocephalus, mass lesion, or abnormal extra-axial fluid collection. No definite CT evidence for acute infarction. Diffuse loss of parenchymal volume is consistent with atrophy. Patchy low attenuation in the deep hemispheric and periventricular white matter is  nonspecific, but likely reflects chronic microvascular ischemic demyelination. Vascular: Atherosclerotic calcification is visualized in the carotid arteries. No dense MCA sign. Major dural sinuses are unremarkable. 6 mm rim calcified slightly hyper attenuating focus in the left upper lobe (image 5 series 2) may reflect saccular aneurysm left MCA. Skull: No  evidence for skull fracture Sinuses/Orbits: The visualized paranasal sinuses and mastoid air cells are clear. Visualized portions of the globes and intraorbital fat are unremarkable. Other: N/A IMPRESSION: 1. No acute intracranial abnormality. 2. 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 CTA or MRA of the brain could be used to further evaluate, as clinically warranted. Electronically Signed   By: Misty Stanley M.D.   On: 07/23/2016 18:33    ____________________________________________   PROCEDURES  Procedure(s) performed:   Procedures   Critical Care performed: No ____________________________________________   INITIAL IMPRESSION / ASSESSMENT AND PLAN / ED COURSE  Pertinent labs & imaging results that were available during my care of the patient were reviewed by me and considered in my medical decision making (see chart for details).  Acute delirium in the setting of dementia due to an infectious process is most likely.  I will evaluate broadly and reassess.  I will also obtain a CT scan of the head without contrast.  The patient is in no acute distress and is happy and communicative at this time in spite of being quite hard of hearing.  Clinical Course  Value Comment By Time  CT HEAD WO CONTRAST No acute findings on the head CT.  I called and spoke with Dr. Tery Sanfilippo, the radiologist who interpreted the head CT, to ask about the aneurysm.  He went back and reviewed it and said that the aneurysm was definitely present and has not changed in size when compared to the last CT scan for 4 years ago.  He felt  that of course a depends on the patient's feelings but that outpatient follow-up is likely all that is necessary.  I will discuss all this shortly with family. Hinda Kehr, MD 09/13 [phone]   I updated the patient and her daughter.  The patient is asymptomatic at this point and the daughter is comfortable with the plan for outpatient follow-up.  I am giving her dose of fosfomycin in case the few white blood cells in her urine represent a mild infection and the urine culture is pending.  I am giving her a dose of potassium to replete a potassium of 3.1 and I am giving her prescription for a few supplements.  She has an appointment for lab work tomorrow at the cancer center which I encouraged her to keep.I gave my usual and customary return precautions.  Hinda Kehr, MD 09/13 2004      ____________________________________________  FINAL CLINICAL IMPRESSION(S) / ED DIAGNOSES  Final diagnoses:  Confusion  Brain aneurysm  Hypokalemia     MEDICATIONS GIVEN DURING THIS VISIT:  Medications  fosfomycin (MONUROL) packet 3 g (not administered)  potassium chloride SA (K-DUR,KLOR-CON) CR tablet 40 mEq (not administered)     NEW OUTPATIENT MEDICATIONS STARTED DURING THIS VISIT:  New Prescriptions   POTASSIUM CHLORIDE SA (KLOR-CON M20) 20 MEQ TABLET    Take 1 tablet (20 mEq total) by mouth daily.    Modified Medications   No medications on file    Discontinued Medications   No medications on file     Note:  This document was prepared using Dragon voice recognition software and may include unintentional dictation errors.    Hinda Kehr, MD 07/23/16 2010

## 2016-07-23 NOTE — ED Triage Notes (Signed)
Pt sent over per Dr Koleen Nimrod for further eval of frequent falls. Dr feels like she is not safe to return home. NAD.

## 2016-07-23 NOTE — ED Triage Notes (Signed)
Pt arrives here from home with reports of abdominal pain and "I just don't feel good."   Pt denies nausea and she denies vomiting   Pt reports that she experienced a fall a couple of days ago but denies hitting her head  HX  HTN,  falls

## 2016-07-24 ENCOUNTER — Inpatient Hospital Stay: Payer: Medicare Other | Attending: Oncology

## 2016-07-25 LAB — URINE CULTURE
Culture: <10000 — AB
Special Requests: NORMAL

## 2016-09-18 ENCOUNTER — Inpatient Hospital Stay: Payer: Medicare Other | Attending: Oncology | Admitting: Oncology

## 2016-09-18 ENCOUNTER — Inpatient Hospital Stay: Payer: Medicare Other

## 2016-09-18 VITALS — BP 160/80 | HR 78 | Temp 98.1°F | Resp 18

## 2016-09-18 DIAGNOSIS — M25511 Pain in right shoulder: Secondary | ICD-10-CM | POA: Insufficient documentation

## 2016-09-18 DIAGNOSIS — D509 Iron deficiency anemia, unspecified: Secondary | ICD-10-CM | POA: Diagnosis not present

## 2016-09-18 DIAGNOSIS — E7800 Pure hypercholesterolemia, unspecified: Secondary | ICD-10-CM | POA: Diagnosis not present

## 2016-09-18 DIAGNOSIS — Z803 Family history of malignant neoplasm of breast: Secondary | ICD-10-CM | POA: Insufficient documentation

## 2016-09-18 DIAGNOSIS — I10 Essential (primary) hypertension: Secondary | ICD-10-CM | POA: Insufficient documentation

## 2016-09-18 DIAGNOSIS — F0390 Unspecified dementia without behavioral disturbance: Secondary | ICD-10-CM | POA: Diagnosis not present

## 2016-09-18 DIAGNOSIS — N2889 Other specified disorders of kidney and ureter: Secondary | ICD-10-CM | POA: Insufficient documentation

## 2016-09-18 DIAGNOSIS — Z79899 Other long term (current) drug therapy: Secondary | ICD-10-CM | POA: Diagnosis not present

## 2016-09-18 DIAGNOSIS — M1990 Unspecified osteoarthritis, unspecified site: Secondary | ICD-10-CM | POA: Diagnosis not present

## 2016-09-18 DIAGNOSIS — D649 Anemia, unspecified: Secondary | ICD-10-CM

## 2016-09-18 DIAGNOSIS — K219 Gastro-esophageal reflux disease without esophagitis: Secondary | ICD-10-CM | POA: Insufficient documentation

## 2016-09-18 DIAGNOSIS — Z809 Family history of malignant neoplasm, unspecified: Secondary | ICD-10-CM | POA: Diagnosis not present

## 2016-09-18 DIAGNOSIS — R531 Weakness: Secondary | ICD-10-CM | POA: Insufficient documentation

## 2016-09-18 DIAGNOSIS — C8220 Follicular lymphoma grade III, unspecified, unspecified site: Secondary | ICD-10-CM

## 2016-09-18 DIAGNOSIS — R5383 Other fatigue: Secondary | ICD-10-CM | POA: Diagnosis not present

## 2016-09-18 DIAGNOSIS — C8290 Follicular lymphoma, unspecified, unspecified site: Secondary | ICD-10-CM

## 2016-09-18 DIAGNOSIS — M25512 Pain in left shoulder: Secondary | ICD-10-CM | POA: Insufficient documentation

## 2016-09-18 DIAGNOSIS — Z7982 Long term (current) use of aspirin: Secondary | ICD-10-CM

## 2016-09-18 LAB — CBC WITH DIFFERENTIAL/PLATELET
BASOS ABS: 0.1 10*3/uL (ref 0–0.1)
BASOS PCT: 1 %
Eosinophils Absolute: 0.2 10*3/uL (ref 0–0.7)
Eosinophils Relative: 3 %
HCT: 28.4 % — ABNORMAL LOW (ref 35.0–47.0)
HEMOGLOBIN: 9.3 g/dL — AB (ref 12.0–16.0)
LYMPHS PCT: 17 %
Lymphs Abs: 0.9 10*3/uL — ABNORMAL LOW (ref 1.0–3.6)
MCH: 29.6 pg (ref 26.0–34.0)
MCHC: 32.9 g/dL (ref 32.0–36.0)
MCV: 90.1 fL (ref 80.0–100.0)
Monocytes Absolute: 0.6 10*3/uL (ref 0.2–0.9)
Monocytes Relative: 10 %
NEUTROS ABS: 4 10*3/uL (ref 1.4–6.5)
NEUTROS PCT: 69 %
Platelets: 275 10*3/uL (ref 150–440)
RBC: 3.15 MIL/uL — AB (ref 3.80–5.20)
RDW: 14.8 % — ABNORMAL HIGH (ref 11.5–14.5)
WBC: 5.7 10*3/uL (ref 3.6–11.0)

## 2016-09-18 LAB — IRON AND TIBC
Iron: 27 ug/dL — ABNORMAL LOW (ref 28–170)
Saturation Ratios: 9 % — ABNORMAL LOW (ref 10.4–31.8)
TIBC: 294 ug/dL (ref 250–450)
UIBC: 267 ug/dL

## 2016-09-18 LAB — FERRITIN: FERRITIN: 21 ng/mL (ref 11–307)

## 2016-09-18 NOTE — Progress Notes (Signed)
States is not feeling well today.

## 2016-09-18 NOTE — Progress Notes (Signed)
[facility]  [phone] [phone]) [phone]  ID: Barbara Chavez OB: January 05, 1927  MR#: [id number]  OIN#:[id number]  Patient Care Team: Kirk Ruths, MD as PCP - General (Internal Medicine)  CHIEF COMPLAINT:  Chief Complaint  Patient presents with  . Lymphoma    INTERVAL HISTORY: Patient returns to clinic for routine 6 month follow up. She has some dementia and much of the history is given by her daughter. She continues to have persistent weakness and fatigue. She continues to have bilateral shoulder pain. She denies any fevers. She has no neurologic complaints. She denies any chest pain or shortness of breath. She has a poor appetite. She denies any nausea, vomiting, constipation, or diarrhea. She has no urinary complaints. Patient offers no further specific complaints today.  REVIEW OF SYSTEMS:   Review of Systems  Constitutional: Positive for malaise/fatigue. Negative for fever and weight loss.  Respiratory: Negative.  Negative for cough and shortness of breath.   Cardiovascular: Negative.  Negative for chest pain and leg swelling.  Gastrointestinal: Negative for abdominal pain, nausea and vomiting.  Genitourinary: Negative.   Musculoskeletal: Positive for joint pain.  Neurological: Positive for weakness. Negative for sensory change.  Psychiatric/Behavioral: Positive for memory loss.    As per HPI. Otherwise, a complete review of systems is negative.  PAST MEDICAL HISTORY: Past Medical History:  Diagnosis Date  . Cancer ([place])   . Dementia   . Hypercholesterolemia   . Hypertension   . Lymphoma ([place])   . Osteoarthritis   . Reflux     PAST SURGICAL HISTORY: Past Surgical History:  Procedure Laterality Date  . CATARACT EXTRACTION    . KNEE ARTHROSCOPY      FAMILY HISTORY Family History  Problem Relation Age of Onset  . Cancer Sister   . Breast cancer Sister        ADVANCED DIRECTIVES:    HEALTH MAINTENANCE: Social  History  Substance Use Topics  . Smoking status: Never Smoker  . Smokeless tobacco: Never Used  . Alcohol use No     Colonoscopy:  PAP:  Bone density:  Lipid panel:  No Known Allergies  Current Outpatient Prescriptions  Medication Sig Dispense Refill  . aspirin EC 81 MG tablet Take 81 mg by mouth daily.    Marland Kitchen atorvastatin (LIPITOR) 40 MG tablet Take 40 mg by mouth at bedtime.    . busPIRone (BUSPAR) 15 MG tablet Take 30 mg by mouth 2 (two) times daily.    . cetirizine (ZYRTEC) 10 MG tablet Take 10 mg by mouth daily as needed for allergies.    . citalopram (CELEXA) 20 MG tablet Take 20 mg by mouth daily.    . cloNIDine (CATAPRES) 0.2 MG tablet Take 0.2 mg by mouth 2 (two) times daily.    Marland Kitchen docusate sodium (COLACE) 100 MG capsule Take 100 mg by mouth every other day.    . Fluticasone Propionate, Inhal, (FLOVENT DISKUS) 100 MCG/BLIST AEPB Inhale 1 puff into the lungs 2 (two) times daily as needed (for shortness of breath).    . labetalol (NORMODYNE) 300 MG tablet Take 600 mg by mouth 2 (two) times daily.    Marland Kitchen omeprazole (PRILOSEC) 20 MG capsule Reported on 03/13/2016    . pantoprazole (PROTONIX) 20 MG tablet Take 20 mg by mouth daily.    Marland Kitchen torsemide (DEMADEX) 20 MG tablet Take 20 mg by mouth daily.    . traMADol (ULTRAM) 50 MG tablet Take 50-100 mg by mouth every 8 (eight)  hours as needed for moderate pain.    . traZODone (DESYREL) 50 MG tablet Take by mouth.    . vitamin B-12 (CYANOCOBALAMIN) 1000 MCG tablet Take 1,000 mcg by mouth daily.     No current facility-administered medications for this visit.     OBJECTIVE: Vitals:   09/18/16 1454  BP: (!) 160/80  Pulse: 78  Resp: 18  Temp: 98.1 F (36.7 C)     There is no height or weight on file to calculate BMI.    ECOG FS:1 - Symptomatic but completely ambulatory  General: Well-developed, well-nourished, no acute distress. Sitting in a wheel chair. Eyes: Pink conjunctiva, anicteric sclera. Lungs: Clear to auscultation  bilaterally. Heart: Regular rate and rhythm. No rubs, murmurs, or gallops. Abdomen: Soft, nontender, nondistended. No organomegaly noted, normoactive bowel sounds. Musculoskeletal: No edema, cyanosis, or clubbing. Neuro: Alert, answering all questions appropriately. Cranial nerves grossly intact. Skin: No rashes or petechiae noted. Psych: Normal affect. Lymphatics: No cervical, calvicular, axillary or inguinal LAD.   LAB RESULTS:  Lab Results  Component Value Date   NA 138 07/23/2016   K 3.1 (L) 07/23/2016   CL 91 (L) 07/23/2016   CO2 35 (H) 07/23/2016   GLUCOSE 122 (H) 07/23/2016   BUN 160 (H) 07/23/2016   CREATININE 2.15 (H) 07/23/2016   CALCIUM 9.8 07/23/2016   PROT 7.3 07/23/2016   ALBUMIN 3.8 07/23/2016   AST 22 07/23/2016   ALT 15 07/23/2016   ALKPHOS 82 07/23/2016   BILITOT 0.6 07/23/2016   GFRNONAA 19 (L) 07/23/2016   GFRAA 23 (L) 07/23/2016    Lab Results  Component Value Date   WBC 5.7 09/18/2016   NEUTROABS 4.0 09/18/2016   HGB 9.3 (L) 09/18/2016   HCT 28.4 (L) 09/18/2016   MCV 90.1 09/18/2016   PLT 275 09/18/2016   Lab Results  Component Value Date   IRON 27 (L) 09/18/2016   TIBC 294 09/18/2016   IRONPCTSAT 9 (L) 09/18/2016   Lab Results  Component Value Date   FERRITIN 21 09/18/2016     STUDIES: No results found.  ASSESSMENT: Grade 1, at least stage III follicular lymphoma, no bone marrow biopsy was performed secondary to advanced age.  PLAN:    1. Follicular lymphoma: PET scan results from September 05, 2015 revealed a complete metabolic response.  Patient completed weekly Rituxan 4 on June 20, 2015.  No further intervention is needed. CT chest from May 26, 2016 reviewed independently with no evidence of recurrence. Will reimage with CT chest, abdomen, and pelvis in July 2018.  Return to clinic in 6 months for routine evaluation.   2. Iron deficiency anemia: Will consider IV Feraheme in the future. 3. Renal insufficiency: Patient's  creatinine is approximately her baseline, monitor. 4. Shoulder pain: Bone scan and PET results previously reviewed revealed significant abnormalities, but not concerning for progressive lymphoma. Continue symptomatic treatment per PCP.  Patient expressed understanding and was in agreement with this plan. She also understands that She can call clinic at any time with any questions, concerns, or complaints.   Lloyd Huger, MD   09/21/2016 9:40 AM

## 2016-10-26 ENCOUNTER — Other Ambulatory Visit
Admission: RE | Admit: 2016-10-26 | Discharge: 2016-10-26 | Disposition: A | Discharge status: Home / Self Care | Payer: Medicare Other | Source: Ambulatory Visit | Attending: Family Medicine | Admitting: Family Medicine

## 2016-10-26 DIAGNOSIS — R05 Cough: Secondary | ICD-10-CM | POA: Insufficient documentation

## 2016-10-26 LAB — BRAIN NATRIURETIC PEPTIDE: B NATRIURETIC PEPTIDE 5: 63 pg/mL (ref 0.0–100.0)

## 2017-01-30 ENCOUNTER — Telehealth: Payer: Self-pay | Admitting: *Deleted

## 2017-01-30 NOTE — Telephone Encounter (Addendum)
Called to ask for earlier appt than May. She has noted a change in her coloring ("she is darker")and she is weak and short of breath.Iron levels in November were low. Can she com in for labs and MD ASAP? Please advise

## 2017-01-30 NOTE — Telephone Encounter (Signed)
Next week is fine

## 2017-01-30 NOTE — Telephone Encounter (Signed)
Can you please move her appts to next week and call daughter with time and date

## 2017-01-31 ENCOUNTER — Emergency Department: Payer: Medicare Other

## 2017-01-31 ENCOUNTER — Inpatient Hospital Stay
Admission: EM | Admit: 2017-01-31 | Discharge: 2017-02-03 | DRG: 682 | Disposition: A | Discharge status: Skilled Nursing Facility | Payer: Medicare Other | Attending: Specialist | Admitting: Specialist

## 2017-01-31 DIAGNOSIS — E870 Hyperosmolality and hypernatremia: Secondary | ICD-10-CM | POA: Diagnosis not present

## 2017-01-31 DIAGNOSIS — N179 Acute kidney failure, unspecified: Principal | ICD-10-CM | POA: Diagnosis present

## 2017-01-31 DIAGNOSIS — F0280 Dementia in other diseases classified elsewhere without behavioral disturbance: Secondary | ICD-10-CM

## 2017-01-31 DIAGNOSIS — E7800 Pure hypercholesterolemia, unspecified: Secondary | ICD-10-CM | POA: Diagnosis present

## 2017-01-31 DIAGNOSIS — N189 Chronic kidney disease, unspecified: Secondary | ICD-10-CM | POA: Diagnosis present

## 2017-01-31 DIAGNOSIS — E860 Dehydration: Secondary | ICD-10-CM | POA: Diagnosis present

## 2017-01-31 DIAGNOSIS — E872 Acidosis: Secondary | ICD-10-CM | POA: Diagnosis present

## 2017-01-31 DIAGNOSIS — F329 Major depressive disorder, single episode, unspecified: Secondary | ICD-10-CM | POA: Diagnosis present

## 2017-01-31 DIAGNOSIS — Z66 Do not resuscitate: Secondary | ICD-10-CM | POA: Diagnosis present

## 2017-01-31 DIAGNOSIS — Z79899 Other long term (current) drug therapy: Secondary | ICD-10-CM | POA: Diagnosis not present

## 2017-01-31 DIAGNOSIS — E876 Hypokalemia: Secondary | ICD-10-CM | POA: Diagnosis present

## 2017-01-31 DIAGNOSIS — Z6829 Body mass index (BMI) 29.0-29.9, adult: Secondary | ICD-10-CM | POA: Diagnosis not present

## 2017-01-31 DIAGNOSIS — I959 Hypotension, unspecified: Secondary | ICD-10-CM | POA: Diagnosis present

## 2017-01-31 DIAGNOSIS — Z515 Encounter for palliative care: Secondary | ICD-10-CM

## 2017-01-31 DIAGNOSIS — K2970 Gastritis, unspecified, without bleeding: Secondary | ICD-10-CM | POA: Diagnosis present

## 2017-01-31 DIAGNOSIS — J189 Pneumonia, unspecified organism: Secondary | ICD-10-CM

## 2017-01-31 DIAGNOSIS — K219 Gastro-esophageal reflux disease without esophagitis: Secondary | ICD-10-CM | POA: Diagnosis present

## 2017-01-31 DIAGNOSIS — N183 Chronic kidney disease, stage 3 (moderate): Secondary | ICD-10-CM | POA: Diagnosis not present

## 2017-01-31 DIAGNOSIS — Z7189 Other specified counseling: Secondary | ICD-10-CM

## 2017-01-31 DIAGNOSIS — D638 Anemia in other chronic diseases classified elsewhere: Secondary | ICD-10-CM | POA: Diagnosis present

## 2017-01-31 DIAGNOSIS — I878 Other specified disorders of veins: Secondary | ICD-10-CM | POA: Diagnosis present

## 2017-01-31 DIAGNOSIS — G9341 Metabolic encephalopathy: Secondary | ICD-10-CM | POA: Diagnosis present

## 2017-01-31 DIAGNOSIS — I129 Hypertensive chronic kidney disease with stage 1 through stage 4 chronic kidney disease, or unspecified chronic kidney disease: Secondary | ICD-10-CM | POA: Diagnosis present

## 2017-01-31 DIAGNOSIS — N170 Acute kidney failure with tubular necrosis: Secondary | ICD-10-CM | POA: Diagnosis not present

## 2017-01-31 DIAGNOSIS — Z8572 Personal history of non-Hodgkin lymphomas: Secondary | ICD-10-CM | POA: Diagnosis not present

## 2017-01-31 DIAGNOSIS — E43 Unspecified severe protein-calorie malnutrition: Secondary | ICD-10-CM | POA: Diagnosis present

## 2017-01-31 DIAGNOSIS — Z781 Physical restraint status: Secondary | ICD-10-CM

## 2017-01-31 DIAGNOSIS — Z7982 Long term (current) use of aspirin: Secondary | ICD-10-CM

## 2017-01-31 DIAGNOSIS — E785 Hyperlipidemia, unspecified: Secondary | ICD-10-CM | POA: Diagnosis present

## 2017-01-31 DIAGNOSIS — N184 Chronic kidney disease, stage 4 (severe): Secondary | ICD-10-CM | POA: Diagnosis present

## 2017-01-31 DIAGNOSIS — R4182 Altered mental status, unspecified: Secondary | ICD-10-CM

## 2017-01-31 DIAGNOSIS — Z803 Family history of malignant neoplasm of breast: Secondary | ICD-10-CM

## 2017-01-31 DIAGNOSIS — R600 Localized edema: Secondary | ICD-10-CM

## 2017-01-31 LAB — CBC
HCT: 27.2 % — ABNORMAL LOW (ref 35.0–47.0)
Hemoglobin: 9.1 g/dL — ABNORMAL LOW (ref 12.0–16.0)
MCH: 28.6 pg (ref 26.0–34.0)
MCHC: 33.3 g/dL (ref 32.0–36.0)
MCV: 86 fL (ref 80.0–100.0)
PLATELETS: 258 10*3/uL (ref 150–440)
RBC: 3.16 MIL/uL — AB (ref 3.80–5.20)
RDW: 14.8 % — ABNORMAL HIGH (ref 11.5–14.5)
WBC: 7.5 10*3/uL (ref 3.6–11.0)

## 2017-01-31 LAB — URINALYSIS, COMPLETE (UACMP) WITH MICROSCOPIC
Bacteria, UA: NONE SEEN
Bilirubin Urine: NEGATIVE
GLUCOSE, UA: NEGATIVE mg/dL
HGB URINE DIPSTICK: NEGATIVE
Ketones, ur: NEGATIVE mg/dL
LEUKOCYTES UA: NEGATIVE
Nitrite: NEGATIVE
PROTEIN: NEGATIVE mg/dL
Specific Gravity, Urine: 1.01 (ref 1.005–1.030)
pH: 6 (ref 5.0–8.0)

## 2017-01-31 LAB — COMPREHENSIVE METABOLIC PANEL
ALT: 20 U/L (ref 14–54)
AST: 32 U/L (ref 15–41)
Albumin: 3.5 g/dL (ref 3.5–5.0)
Alkaline Phosphatase: 64 U/L (ref 38–126)
Anion gap: 12 (ref 5–15)
BUN: 113 mg/dL — ABNORMAL HIGH (ref 6–20)
CHLORIDE: 90 mmol/L — AB (ref 101–111)
CO2: 38 mmol/L — ABNORMAL HIGH (ref 22–32)
CREATININE: 4.18 mg/dL — AB (ref 0.44–1.00)
Calcium: 9.8 mg/dL (ref 8.9–10.3)
GFR, EST AFRICAN AMERICAN: 10 mL/min — AB (ref >60)
GFR, EST NON AFRICAN AMERICAN: 9 mL/min — AB (ref >60)
Glucose, Bld: 130 mg/dL — ABNORMAL HIGH (ref 65–99)
Potassium: 2.2 mmol/L — CL (ref 3.5–5.1)
Sodium: 140 mmol/L (ref 135–145)
Total Bilirubin: 0.9 mg/dL (ref 0.3–1.2)
Total Protein: 7.3 g/dL (ref 6.5–8.1)

## 2017-01-31 LAB — LACTIC ACID, PLASMA
Lactic Acid, Venous: 2.1 mmol/L (ref 0.5–1.9)
Lactic Acid, Venous: 0.9 mmol/L (ref 0.5–1.9)

## 2017-01-31 LAB — MAGNESIUM: Magnesium: 1.5 mg/dL — ABNORMAL LOW (ref 1.7–2.4)

## 2017-01-31 MED ORDER — FLUTICASONE PROPIONATE 50 MCG/ACT NA SUSP
2.0000 | Freq: Every day | NASAL | Status: DC
  Administered 2017-02-02: 2 via NASAL
  Filled 2017-01-31: qty 16

## 2017-01-31 MED ORDER — POTASSIUM CHLORIDE IN NACL 40-0.9 MEQ/L-% IV SOLN
INTRAVENOUS | Status: DC
  Administered 2017-01-31 – 2017-02-02 (×3): 75 mL/h via INTRAVENOUS
  Filled 2017-01-31 (×5): qty 1000

## 2017-01-31 MED ORDER — ESCITALOPRAM OXALATE 10 MG PO TABS
20.0000 mg | ORAL_TABLET | Freq: Every day | ORAL | Status: DC
  Administered 2017-02-02 – 2017-02-03 (×2): 20 mg via ORAL
  Filled 2017-01-31 (×3): qty 2

## 2017-01-31 MED ORDER — ONDANSETRON HCL 4 MG/2ML IJ SOLN: 4.0000 mg | Freq: Four times a day (QID) | INTRAMUSCULAR | Status: DC | PRN

## 2017-01-31 MED ORDER — HEPARIN SODIUM (PORCINE) 5000 UNIT/ML IJ SOLN
5000.0000 [IU] | Freq: Three times a day (TID) | INTRAMUSCULAR | Status: DC
  Administered 2017-02-01 – 2017-02-03 (×5): 5000 [IU] via SUBCUTANEOUS
  Filled 2017-01-31 (×5): qty 1

## 2017-01-31 MED ORDER — MAGNESIUM SULFATE 2 GM/50ML IV SOLN
2.0000 g | Freq: Once | INTRAVENOUS | Status: AC
  Administered 2017-01-31: 2 g via INTRAVENOUS
  Filled 2017-01-31: qty 50

## 2017-01-31 MED ORDER — SODIUM CHLORIDE 0.9 % IV SOLN
30.0000 meq | Freq: Once | INTRAVENOUS | Status: AC
  Administered 2017-01-31: 30 meq via INTRAVENOUS
  Filled 2017-01-31: qty 15

## 2017-01-31 MED ORDER — BUSPIRONE HCL 5 MG PO TABS
15.0000 mg | ORAL_TABLET | Freq: Three times a day (TID) | ORAL | Status: DC
  Administered 2017-02-01 – 2017-02-03 (×5): 15 mg via ORAL
  Filled 2017-01-31 (×7): qty 3

## 2017-01-31 MED ORDER — ONDANSETRON HCL 4 MG PO TABS: 4.0000 mg | ORAL_TABLET | Freq: Four times a day (QID) | ORAL | Status: DC | PRN

## 2017-01-31 MED ORDER — ATORVASTATIN CALCIUM 20 MG PO TABS
40.0000 mg | ORAL_TABLET | Freq: Every day | ORAL | Status: DC
  Administered 2017-02-01 – 2017-02-02 (×2): 40 mg via ORAL
  Filled 2017-01-31 (×2): qty 2

## 2017-01-31 MED ORDER — SENNOSIDES-DOCUSATE SODIUM 8.6-50 MG PO TABS
1.0000 | ORAL_TABLET | Freq: Every evening | ORAL | Status: DC | PRN
Start: 2017-01-31 — End: 2017-02-03

## 2017-01-31 MED ORDER — TRAZODONE HCL 100 MG PO TABS
100.0000 mg | ORAL_TABLET | Freq: Every day | ORAL | Status: DC
  Administered 2017-01-31 – 2017-02-02 (×3): 100 mg via ORAL
  Filled 2017-01-31 (×3): qty 1

## 2017-01-31 MED ORDER — ASPIRIN EC 81 MG PO TBEC
81.0000 mg | DELAYED_RELEASE_TABLET | Freq: Every day | ORAL | Status: DC
  Administered 2017-02-02 – 2017-02-03 (×2): 81 mg via ORAL
  Filled 2017-01-31 (×3): qty 1

## 2017-01-31 MED ORDER — ACETAMINOPHEN 650 MG RE SUPP: 650.0000 mg | Freq: Four times a day (QID) | RECTAL | Status: DC | PRN

## 2017-01-31 MED ORDER — BISACODYL 5 MG PO TBEC: 5.0000 mg | DELAYED_RELEASE_TABLET | Freq: Every day | ORAL | Status: DC | PRN

## 2017-01-31 MED ORDER — SODIUM CHLORIDE 0.9 % IV BOLUS (SEPSIS)
1000.0000 mL | Freq: Once | INTRAVENOUS | Status: AC
  Administered 2017-01-31: 1000 mL via INTRAVENOUS

## 2017-01-31 MED ORDER — ALBUTEROL SULFATE (2.5 MG/3ML) 0.083% IN NEBU: 2.5000 mg | INHALATION_SOLUTION | RESPIRATORY_TRACT | Status: DC | PRN

## 2017-01-31 MED ORDER — POLYETHYLENE GLYCOL 3350 17 G PO PACK
17.0000 g | PACK | Freq: Every evening | ORAL | Status: DC
  Administered 2017-02-02: 17 g via ORAL
  Filled 2017-01-31 (×2): qty 1

## 2017-01-31 MED ORDER — PANTOPRAZOLE SODIUM 40 MG PO TBEC
40.0000 mg | DELAYED_RELEASE_TABLET | Freq: Every day | ORAL | Status: DC
Start: 2017-02-01 — End: 2017-02-03
  Administered 2017-02-02 – 2017-02-03 (×2): 40 mg via ORAL
  Filled 2017-01-31 (×3): qty 1

## 2017-01-31 MED ORDER — POTASSIUM CHLORIDE 20 MEQ PO PACK
40.0000 meq | PACK | Freq: Once | ORAL | Status: AC
  Administered 2017-01-31: 40 meq via ORAL
  Filled 2017-01-31: qty 2

## 2017-01-31 MED ORDER — HYDROCODONE-ACETAMINOPHEN 5-325 MG PO TABS
1.0000 | ORAL_TABLET | ORAL | Status: DC | PRN
  Administered 2017-01-31: 1 via ORAL
  Filled 2017-01-31 (×2): qty 1

## 2017-01-31 MED ORDER — ACETAMINOPHEN 325 MG PO TABS: 650.0000 mg | ORAL_TABLET | Freq: Four times a day (QID) | ORAL | Status: DC | PRN

## 2017-01-31 MED ORDER — QUETIAPINE FUMARATE 25 MG PO TABS
25.0000 mg | ORAL_TABLET | Freq: Every day | ORAL | Status: DC
  Administered 2017-01-31 – 2017-02-02 (×3): 25 mg via ORAL
  Filled 2017-01-31 (×3): qty 1

## 2017-01-31 NOTE — NC FL2 (Signed)
[place] LEVEL OF CARE SCREENING TOOL     IDENTIFICATION  Patient Name: Barbara Chavez Birthdate: 25-Nov-1926 Sex: female Admission Date (Current Location): 01/31/2017  [employer] and [place] Number:  Engineering geologist and Address:  [facility], [address], [place], [place] [address]      Provider Number: [id number]  Attending Physician Name and Address:  Demetrios Loll, MD  Relative Name and Phone Number:       Current Level of Care: Hospital Recommended Level of Care: [facility] Prior Approval Number:    Date Approved/Denied:   PASRR Number:    Discharge Plan: Domiciliary (Rest home)    Current Diagnoses: Patient Active Problem List   Diagnosis Date Noted  . Renal failure (ARF), acute on chronic ([place]) 01/31/2017  . Hyponatremia 06/27/2015  . Follicular non-Hodgkin's lymphoma ([place]) 05/24/2015  . Adenopathy 05/03/2015  . Anemia 04/20/2015  . 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 Humerus lesion, right 04/20/2015    Orientation RESPIRATION BLADDER Height & Weight     Self  Normal Continent (At night time she wears liners) Weight: 200 lb (90.7 kg) Height:  5\' 7"  (170.2 cm)  BEHAVIORAL SYMPTOMS/MOOD NEUROLOGICAL BOWEL NUTRITION STATUS      Continent Diet (Normal)  AMBULATORY STATUS COMMUNICATION OF NEEDS Skin   Independent Verbally Normal                       Personal Care Assistance Level of Assistance  Bathing, Feeding, Dressing, Total care Bathing Assistance: Limited assistance Feeding assistance: Independent Dressing Assistance: Limited assistance Total Care Assistance: Limited assistance   Functional Limitations Info  Sight, Hearing, Speech Sight Info: Adequate (wears glasses) Hearing Info: Impaired (Hard of hearing both ears) Speech Info: Adequate    SPECIAL CARE FACTORS FREQUENCY                       Contractures Contractures Info: Not present    Additional Factors Info   Code Status Code Status Info: full             Current Medications (01/31/2017):  This is the current hospital active medication list Current Facility-Administered Medications  Medication Dose Route Frequency Provider Last Rate Last Dose  . potassium chloride 30 mEq in sodium chloride 0.9 % 265 mL (KCL MULTIRUN) IVPB  30 mEq Intravenous Once Rudene Re, MD   30 mEq at 01/31/17 1239   Current Outpatient Prescriptions  Medication Sig Dispense Refill  . aspirin EC 81 MG tablet Take 81 mg by mouth daily.    Marland Kitchen atorvastatin (LIPITOR) 40 MG tablet Take 40 mg by mouth at bedtime.    . busPIRone (BUSPAR) 15 MG tablet Take 15 mg by mouth 3 (three) times daily.     . Calcium Carbonate (CALCIUM-CARB 600 PO) Take 600 mg by mouth daily.    . Cholecalciferol (VITAMIN D3) 2000 units capsule Take 2,000 Units by mouth daily.    . cloNIDine (CATAPRES) 0.2 MG tablet Take 0.2 mg by mouth 2 (two) times daily.    Marland Kitchen escitalopram (LEXAPRO) 20 MG tablet Take 20 mg by mouth daily.    . fluticasone (FLONASE) 50 MCG/ACT nasal spray Place 2 sprays into both nostrils daily.     Marland Kitchen labetalol (NORMODYNE) 300 MG tablet Take 600 mg by mouth 2 (two) times daily.    . metolazone (ZAROXOLYN) 5 MG tablet Take 5 mg by mouth 2 (two) times daily.    Marland Kitchen  polyethylene glycol (MIRALAX / GLYCOLAX) packet Take 17 g by mouth daily.    . QUEtiapine (SEROQUEL) 25 MG tablet Take 25 mg by mouth at bedtime.    . torsemide (DEMADEX) 10 MG tablet Take 10 mg by mouth daily.     . traMADol (ULTRAM) 50 MG tablet Take 50 mg by mouth every 6 (six) hours as needed for moderate pain.     . traZODone (DESYREL) 100 MG tablet Take 100 mg by mouth.     . pantoprazole (PROTONIX) 40 MG tablet Take 40 mg by mouth daily.        Discharge Medications: Please see discharge summary for a list of discharge medications.  Relevant Imaging Results:  Relevant Lab Results:   Additional Information  SSN [id number]  Joana Reamer,  Zeb

## 2017-01-31 NOTE — ED Triage Notes (Signed)
Pt to ED from [facility] c/o altered mental status. Per EMS staff at care home reported pt became unresponsive after breakfast and at the scene blood pressure was 86/36. Pt has hx of dementia and at this time alert and in no acute distress.

## 2017-01-31 NOTE — Progress Notes (Signed)
LCSW met with patients daughter and Care Provider at [facility]. The patient was resting and her daughter and ALF Provider was able to answer questions. Patient has dementia and has [place] health Care/Medicare  Patient is able to return to ALF [facility] as per [facility] Ensign, If the treating doctor plans to send her to rehab family prefers [facility] but understands information is sent out to all close facilities. LCSW completed assessment and started Fl2 ( if required for SNF)  Daimien Patmon LCSW [phone]

## 2017-01-31 NOTE — H&P (Signed)
[facility] [place] NAME: Barbara Chavez    MR#:  [id number]  DATE OF BIRTH:  [birth year]  DATE OF ADMISSION:  01/31/2017  PRIMARY CARE PHYSICIAN: Kirk Ruths., MD   REQUESTING/REFERRING PHYSICIAN: Rudene Re, MD  CHIEF COMPLAINT:   Chief Complaint  Patient presents with  . Altered Mental Status   Altered mental status and poor oral intake for 1 week. HISTORY OF PRESENT ILLNESS:  Barbara Chavez  is a 83 y.o. female with a known history of Dementia, hypertension, lymphoma, hyperlipidemia and gastritis. The patient was sent from assisted living facility due to above chief complaint. Patient is confused and demented and unable to provide any information. According to the patient's daughter and caregiver, the patient had diarrhea one week ago and has had poor oral intake since that time. The patient can talk and walk on her baseline but she has had confusion for the past one week. When EMS arrived patient's blood pressure was 86/36. The patient was found acute renal failure with creatinine up to 4.18, low potassium at 2.2. Chest x-ray could not exclude pneumonia. Urinalysis is normal.  PAST MEDICAL HISTORY:   Past Medical History:  Diagnosis Date  . Cancer ([place])   . Dementia   . Hypercholesterolemia   . Hypertension   . Lymphoma ([place])   . Osteoarthritis   . Reflux     PAST SURGICAL HISTORY:   Past Surgical History:  Procedure Laterality Date  . CATARACT EXTRACTION    . KNEE ARTHROSCOPY      SOCIAL HISTORY:   Social History  Substance Use Topics  . Smoking status: Never Smoker  . Smokeless tobacco: Never Used  . Alcohol use No    FAMILY HISTORY:   Family History  Problem Relation Age of Onset  . Cancer Sister   . Breast cancer Sister     DRUG ALLERGIES:  No Known Allergies  REVIEW OF SYSTEMS:   Review of Systems  Unable to perform ROS: Mental status change    MEDICATIONS AT HOME:   Prior to  Admission medications   Medication Sig Start Date End Date Taking? Authorizing Provider  aspirin EC 81 MG tablet Take 81 mg by mouth daily.   Yes Historical Provider, MD  atorvastatin (LIPITOR) 40 MG tablet Take 40 mg by mouth at bedtime.   Yes Historical Provider, MD  busPIRone (BUSPAR) 15 MG tablet Take 15 mg by mouth 3 (three) times daily.    Yes Historical Provider, MD  Calcium Carbonate (CALCIUM-CARB 600 PO) Take 600 mg by mouth daily.   Yes Historical Provider, MD  Cholecalciferol (VITAMIN D3) 2000 units capsule Take 2,000 Units by mouth daily.   Yes Historical Provider, MD  cloNIDine (CATAPRES) 0.2 MG tablet Take 0.2 mg by mouth 2 (two) times daily.   Yes Historical Provider, MD  escitalopram (LEXAPRO) 20 MG tablet Take 20 mg by mouth daily. 01/06/17  Yes Historical Provider, MD  fluticasone (FLONASE) 50 MCG/ACT nasal spray Place 2 sprays into both nostrils daily.  06/30/16  Yes Historical Provider, MD  labetalol (NORMODYNE) 300 MG tablet Take 600 mg by mouth 2 (two) times daily.   Yes Historical Provider, MD  metolazone (ZAROXOLYN) 5 MG tablet Take 5 mg by mouth 2 (two) times daily.   Yes Historical Provider, MD  polyethylene glycol (MIRALAX / GLYCOLAX) packet Take 17 g by mouth daily. 11/17/16  Yes Historical Provider, MD  QUEtiapine (SEROQUEL) 25 MG tablet Take 25 mg by  mouth at bedtime.   Yes Historical Provider, MD  torsemide (DEMADEX) 10 MG tablet Take 10 mg by mouth daily.    Yes Historical Provider, MD  traMADol (ULTRAM) 50 MG tablet Take 50 mg by mouth every 6 (six) hours as needed for moderate pain.    Yes Historical Provider, MD  traZODone (DESYREL) 100 MG tablet Take 100 mg by mouth.  07/26/15 09/18/17 Yes Historical Provider, MD  pantoprazole (PROTONIX) 40 MG tablet Take 40 mg by mouth daily.     Historical Provider, MD      VITAL SIGNS:  Blood pressure (!) 104/38, pulse (!) 55, temperature 98 F (36.7 C), temperature source Oral, resp. rate 16, height 5\' 7"  (1.702 m), weight 200  lb (90.7 kg), SpO2 94 %.  PHYSICAL EXAMINATION:  Physical Exam  GENERAL:  83 y.o.-year-old patient lying in the bed with no acute distress.  EYES: Pupils equal, round, reactive to light and accommodation. No scleral icterus. Extraocular muscles intact.  HEENT: Head atraumatic, normocephalic. Oropharynx and nasopharynx clear.  NECK:  Supple, no jugular venous distention. No thyroid enlargement, no tenderness.  LUNGS: Normal breath sounds bilaterally, no wheezing, rales,rhonchi or crepitation. No use of accessory muscles of respiration.  CARDIOVASCULAR: S1, S2 normal. No murmurs, rubs, or gallops.  ABDOMEN: Soft, nontender, nondistended. Bowel sounds present. No organomegaly or mass.  EXTREMITIES: Bilateral leg chronic skin changes with trace edema, no cyanosis, or clubbing.  NEUROLOGIC:The patient is confused. Unable to exam. PSYCHIATRIC: The patient is confused and demented. SKIN: No obvious rash, lesion, or ulcer.   LABORATORY PANEL:   CBC  Recent Labs Lab 01/31/17 1036  WBC 7.5  HGB 9.1*  HCT 27.2*  PLT 258   ------------------------------------------------------------------------------------------------------------------  Chemistries   Recent Labs Lab 01/31/17 1036  NA 140  K 2.2*  CL 90*  CO2 38*  GLUCOSE 130*  BUN 113*  CREATININE 4.18*  CALCIUM 9.8  MG 1.5*  AST 32  ALT 20  ALKPHOS 64  BILITOT 0.9   ------------------------------------------------------------------------------------------------------------------  Cardiac Enzymes No results for input(s): TROPONINI in the last 168 hours. ------------------------------------------------------------------------------------------------------------------  RADIOLOGY:  Dg Chest Portable 1 View  Result Date: 01/31/2017 CLINICAL DATA:  Altered mental status EXAM: PORTABLE CHEST 1 VIEW COMPARISON:  07/23/2016 chest radiograph. FINDINGS: Stable cardiomediastinal silhouette with top-normal heart size and aortic  atherosclerosis. No pneumothorax. No pleural effusion. Faint hazy peripheral left mid lung opacity appears new. Otherwise clear lungs. IMPRESSION: 1. New faint hazy peripheral left mid lung opacity, cannot exclude a developing pneumonia. Recommend follow-up PA and lateral post treatment chest radiographs in 4-6 weeks. 2. Aortic atherosclerosis. Electronically Signed   By: Ilona Sorrel M.D.   On: 01/31/2017 12:01      IMPRESSION AND PLAN:   Acutefailure on CKD due to dehydration and poor oral intake. The patient will be admitted to medical floor. Hold Lasix, start normal saline with potassium IV. Follow-up BMP. Nephrology consult.  Severe hypokalemia. The patient was given potassium in ED, I will start normal saline with potassium IV and follow-up BMPl.  Hypomagnesemia. Give magnesium me ED. Follow-up level tomorrow.  Hypotension. Hold patient home hypertension medication. Continue IV fluid support.  HAP. Chest x-ray showed New faint hazy peripheral left mid lung opacity, cannot exclude a developing pneumonia.  Start cefepime and vancomycin pharmacy to dose. Follow-up cultures and CBC.  Lactic acidosis. Continue treatment as above. Follow-up lactic acid.  Acute metabolic encephalopathy due to above. Aspiration and fall precaution.  Anemia of chronic disease. Stable. Dementia. Aspiration  and fall precaution.  All the records are reviewed and case discussed with ED provider. Management plans discussed with the patient's daughter and the caregiver and they are in agreement.  CODE STATUS: DO NOT RESUSCITATE.  TOTAL TIME TAKING CARE OF THIS PATIENT: 58 minutes.    Demetrios Loll M.D on 01/31/2017 at 12:49 PM  Between 7am to 6pm - Pager - [phone]  After 6pm go to [email or web address] - Proofreader  [facility]  Office  [phone]  CC: Primary care physician; Kirk Ruths., MD   Note: This dictation was prepared with Dragon dictation along  with smaller phrase technology. Any transcriptional errors that result from this process are unintentional.

## 2017-01-31 NOTE — ED Provider Notes (Signed)
[facility] Emergency Department Provider Note  ____________________________________________  Time seen: Approximately 11:29 AM  I have reviewed the triage vital signs and the nursing notes.   HISTORY  Chief Complaint Altered Mental Status  Level 5 caveat:  Portions of the history and physical were unable to be obtained due to AMS   HPI Barbara Chavez is a 83 y.o. female with a history of dementia, lymphoma in remission, hypertension and hyperlipidemia who presents for evaluation of altered mental status. Patient is unable to provide history which according to her daughter who is at the bedside is her baseline. The daughter said that patient has been sleeping most of the day, not eating or drinking, decreased level of activity for the course of a week. She has been falling asleep in the middle of her meals. Today she had an episode of [facility] after breakfast. When EMS arrived patient's blood pressure was 86/36. According to the daughter patient had URI symptoms in January. She had a few episodes of diarrhea a week ago but those have resolved and no more diarrhea for the last week. No vomiting, no fever or chills.  Past Medical History:  Diagnosis Date  . Cancer ([place])   . Dementia   . Hypercholesterolemia   . Hypertension   . Lymphoma ([place])   . Osteoarthritis   . Reflux     Patient Active Problem List   Diagnosis Date Noted  . Renal failure (ARF), acute on chronic ([place]) 01/31/2017  . Hyponatremia 06/27/2015  . Follicular non-Hodgkin's lymphoma ([place]) 05/24/2015  . Adenopathy 05/03/2015  . Anemia 04/20/2015  . 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 Humerus lesion, right 04/20/2015    Past Surgical History:  Procedure Laterality Date  . CATARACT EXTRACTION    . KNEE ARTHROSCOPY      Prior to Admission medications   Medication Sig Start Date End Date Taking? Authorizing Provider  aspirin EC 81 MG  tablet Take 81 mg by mouth daily.   Yes Historical Provider, MD  atorvastatin (LIPITOR) 40 MG tablet Take 40 mg by mouth at bedtime.   Yes Historical Provider, MD  busPIRone (BUSPAR) 15 MG tablet Take 15 mg by mouth 3 (three) times daily.    Yes Historical Provider, MD  Calcium Carbonate (CALCIUM-CARB 600 PO) Take 600 mg by mouth daily.   Yes Historical Provider, MD  Cholecalciferol (VITAMIN D3) 2000 units capsule Take 2,000 Units by mouth daily.   Yes Historical Provider, MD  cloNIDine (CATAPRES) 0.2 MG tablet Take 0.2 mg by mouth 2 (two) times daily.   Yes Historical Provider, MD  escitalopram (LEXAPRO) 20 MG tablet Take 20 mg by mouth daily. 01/06/17  Yes Historical Provider, MD  fluticasone (FLONASE) 50 MCG/ACT nasal spray Place 2 sprays into both nostrils daily.  06/30/16  Yes Historical Provider, MD  labetalol (NORMODYNE) 300 MG tablet Take 600 mg by mouth 2 (two) times daily.   Yes Historical Provider, MD  metolazone (ZAROXOLYN) 5 MG tablet Take 5 mg by mouth 2 (two) times daily.   Yes Historical Provider, MD  polyethylene glycol (MIRALAX / GLYCOLAX) packet Take 17 g by mouth daily. 11/17/16  Yes Historical Provider, MD  QUEtiapine (SEROQUEL) 25 MG tablet Take 25 mg by mouth at bedtime.   Yes Historical Provider, MD  torsemide (DEMADEX) 10 MG tablet Take 10 mg by mouth daily.    Yes Historical Provider, MD  traMADol (ULTRAM) 50 MG tablet Take 50 mg by mouth  every 6 (six) hours as needed for moderate pain.    Yes Historical Provider, MD  traZODone (DESYREL) 100 MG tablet Take 100 mg by mouth.  07/26/15 09/18/17 Yes Historical Provider, MD  pantoprazole (PROTONIX) 40 MG tablet Take 40 mg by mouth daily.     Historical Provider, MD    Allergies Patient has no known allergies.  Family History  Problem Relation Age of Onset  . Cancer Sister   . Breast cancer Sister     Social History Social History  Substance Use Topics  . Smoking status: Never Smoker  . Smokeless tobacco: Never Used  .  Alcohol use No    Review of Systems  Constitutional: Negative for fever. + AMS Eyes: Negative for visual changes. ENT: Negative for sore throat. Neck: No neck pain  Cardiovascular: Negative for chest pain. Respiratory: Negative for shortness of breath. Gastrointestinal: Negative for abdominal pain, vomiting. + diarrhea. Genitourinary: Negative for dysuria. Musculoskeletal: Negative for back pain. Skin: Negative for rash. Neurological: Negative for headaches, weakness or numbness. Psych: No SI or HI  ____________________________________________   PHYSICAL EXAM:  VITAL SIGNS: ED Triage Vitals [01/31/17 1001]  Enc Vitals Group     BP (!) 123/56     Pulse Rate (!) 55     Resp 18     Temp 98 F (36.7 C)     Temp Source Oral     SpO2 98 %     Weight 200 lb (90.7 kg)     Height 5\' 7"  (1.702 m)     Head Circumference      Peak Flow      Pain Score      Pain Loc      Pain Edu?      Excl. in [place]?     Constitutional: Alert and oriented will not answer to questions, smiles when you talk to her. No distress HEENT:      Head: Normocephalic and atraumatic.         Eyes: Conjunctivae are normal. Sclera is non-icteric. EOMI. PERRL      Mouth/Throat: Mucous membranes are dry.       Neck: Supple with no signs of meningismus. Cardiovascular: Regular rate and rhythm. No murmurs, gallops, or rubs. 2+ symmetrical distal pulses are present in all extremities. No JVD. Respiratory: Normal respiratory effort. Lungs are clear to auscultation bilaterally. No wheezes, crackles, or rhonchi.  Gastrointestinal: Soft, non tender, and non distended with positive bowel sounds. No rebound or guarding. Musculoskeletal: Nontender with normal range of motion in all extremities. No edema, cyanosis, or erythema of extremities. Neurologic: Normal speech and language. Face is symmetric. Moving all extremities. No gross focal neurologic deficits are appreciated. Skin: Skin is warm, dry and intact. No rash  noted.   ____________________________________________   LABS (all labs ordered are listed, but only abnormal results are displayed)  Labs Reviewed  COMPREHENSIVE METABOLIC PANEL - Abnormal; Notable for the following:       Result Value   Potassium 2.2 (*)    Chloride 90 (*)    CO2 38 (*)    Glucose, Bld 130 (*)    BUN 113 (*)    Creatinine, Ser 4.18 (*)    GFR calc non Af Amer 9 (*)    GFR calc Af Amer 10 (*)    All other components within normal limits  CBC - Abnormal; Notable for the following:    RBC 3.16 (*)    Hemoglobin 9.1 (*)  HCT 27.2 (*)    RDW 14.8 (*)    All other components within normal limits  URINALYSIS, COMPLETE (UACMP) WITH MICROSCOPIC - Abnormal; Notable for the following:    Color, Urine YELLOW (*)    APPearance CLEAR (*)    Squamous Epithelial / LPF 0-5 (*)    All other components within normal limits  LACTIC ACID, PLASMA - Abnormal; Notable for the following:    Lactic Acid, Venous 2.1 (*)    All other components within normal limits  MAGNESIUM - Abnormal; Notable for the following:    Magnesium 1.5 (*)    All other components within normal limits  LACTIC ACID, PLASMA  CBG MONITORING, ED   ____________________________________________  EKG  ED ECG REPORT I, Rudene Re, the attending physician, personally viewed and interpreted this ECG.  Sinus bradycardia, rate of 55, incomplete left bundle branch block, prolonged QTC at 645, normal axis, no ST elevations or depressions. Bradycardia and prolonged QT or new when compared to prior.  ____________________________________________  RADIOLOGY  CXR: PND ____________________________________________   PROCEDURES  Procedure(s) performed: None Procedures Critical Care performed: yes  CRITICAL CARE Performed by: Rudene Re  ?  Total critical care time: 35 min  Critical care time was exclusive of separately billable procedures and treating other patients.  Critical care was  necessary to treat or prevent imminent or life-threatening deterioration.  Critical care was time spent personally by me on the following activities: development of treatment plan with patient and/or surrogate as well as nursing, discussions with consultants, evaluation of patient's response to treatment, examination of patient, obtaining history from patient or surrogate, ordering and performing treatments and interventions, ordering and review of laboratory studies, ordering and review of radiographic studies, pulse oximetry and re-evaluation of patient's condition.  ____________________________________________   INITIAL IMPRESSION / ASSESSMENT AND PLAN / ED COURSE   83 y.o. female with a history of dementia, lymphoma in remission, hypertension and hyperlipidemia who presents for evaluation of altered mental status, increased somnolence, decreased by mouth over the course of the week which was preceded by a diarrheal illness. The patient is grossly neurologically intact, has normal vital signs, physical exam with no abdominal tenderness, lungs clear to auscultation. Blood work showing hypokalemia with a K of 2.2, and acute on chronic kidney injury with creatinine of 4.18 (baseline 2). Lactate is elevated at 2.1 in the setting of dehydration. UA with no evidence of UTI. Chest x-ray pending to rule out pneumonia but no signs or symptoms of such based on history and physical exam. We'll give IV fluids, replete K and mag and admit to Hospitalist service.      Pertinent labs & imaging results that were available during my care of the patient were reviewed by me and considered in my medical decision making (see chart for details).    ____________________________________________   FINAL CLINICAL IMPRESSION(S) / ED DIAGNOSES  Final diagnoses:  Altered mental status, unspecified altered mental status type  AKI (acute kidney injury) ([place])  Hypokalemia  Community acquired pneumonia, unspecified  laterality      NEW MEDICATIONS STARTED DURING THIS VISIT:  Current Discharge Medication List       Note:  This document was prepared using Dragon voice recognition software and may include unintentional dictation errors.    Rudene Re, MD 01/31/17 [phone]

## 2017-01-31 NOTE — Clinical Social Work Note (Signed)
Clinical Social Work Assessment  Patient Details  Name: Barbara Chavez MRN: [id number] Date of Birth: 03/25/1927  Date of referral:  01/31/17               Reason for consult:       From ALF            Permission sought to share information with:  Family Supports, Customer service manager Permission granted to share information::  Yes, Verbal Permission Granted  Name::        Agency::     Relationship::  Barbara Chavez  [phone]  Contact Information:  [facility]  Housing/Transportation Living arrangements for the past 2 months:  [place] of Information:  Facility Patient Interpreter Needed:  None Criminal Activity/Legal Involvement Pertinent to Current Situation/Hospitalization:  No - Comment as needed Significant Relationships:  Adult Children Lives with:  Facility Resident Do you feel safe going back to the place where you live?  Yes Need for family participation in patient care:  No (Coment)  Care giving concerns:  TBD   Social Worker assessment / plan:  Barbara Chavez  is a 83 y.o. female with a known history of Dementia, hypertension, lymphoma, hyperlipidemia and gastritis. The patient was sent from assisted living facility due to above chief complaint. Patient is confused and demented and unable to provide any information. According to the patient's daughter and caregiver, the patient had diarrhea one week ago and has had poor oral intake since that time. The patient can talk and walk on her baseline but she has had confusion for the past one week. When EMS arrived patient's blood pressure was 86/36. The patient was found acute renal failure with creatinine up to 4.18, low potassium at 2.2. Chest x-ray could not exclude pneumonia. Urinalysis is normal. Patient has [place] health care Medicare. LCSW will attempt to reach daughter for additional information to complete assessment. Unable to reach staff at ALF  Employment status:   Retired Insurance underwriter information:  Information systems manager (Scientist, water quality) PT Recommendations:    Information / Referral to community resources:  Other (Comment Required) (Unable to assess)  Patient/Family's Response to care: TBD  Patient/Family's Understanding of and Emotional Response to Diagnosis, Current Treatment, and Prognosis:  TBD  Emotional Assessment Appearance:  Appears stated age Attitude/Demeanor/Rapport:  Unable to Assess Affect (typically observed):    Orientation:  Fluctuating Orientation (Suspected and/or reported Sundowners) Alcohol / Substance use:  Not Applicable Psych involvement (Current and /or in the community):  No (Comment)  Discharge Needs  Concerns to be addressed:  Care Coordination Readmission within the last 30 days:    Current discharge risk:  Chronically ill Barriers to Discharge:  Continued Medical Work up   [place], LCSW 01/31/2017, 1:22 PM

## 2017-01-31 NOTE — Progress Notes (Signed)
Called [facility] [phone] and discovered she is the room of patient.   [employer] LCSW [phone]

## 2017-02-01 LAB — BASIC METABOLIC PANEL
ANION GAP: 9 (ref 5–15)
BUN: 110 mg/dL — ABNORMAL HIGH (ref 6–20)
CHLORIDE: 100 mmol/L — AB (ref 101–111)
CO2: 35 mmol/L — ABNORMAL HIGH (ref 22–32)
Calcium: 9.2 mg/dL (ref 8.9–10.3)
Creatinine, Ser: 3.28 mg/dL — ABNORMAL HIGH (ref 0.44–1.00)
GFR calc Af Amer: 13 mL/min — ABNORMAL LOW (ref >60)
GFR, EST NON AFRICAN AMERICAN: 12 mL/min — AB (ref >60)
Glucose, Bld: 87 mg/dL (ref 65–99)
POTASSIUM: 2.9 mmol/L — AB (ref 3.5–5.1)
SODIUM: 144 mmol/L (ref 135–145)

## 2017-02-01 LAB — LACTIC ACID, PLASMA: LACTIC ACID, VENOUS: 0.8 mmol/L (ref 0.5–1.9)

## 2017-02-01 LAB — CBC
HCT: 27 % — ABNORMAL LOW (ref 35.0–47.0)
HEMOGLOBIN: 9 g/dL — AB (ref 12.0–16.0)
MCH: 29 pg (ref 26.0–34.0)
MCHC: 33.4 g/dL (ref 32.0–36.0)
MCV: 86.9 fL (ref 80.0–100.0)
PLATELETS: 223 10*3/uL (ref 150–440)
RBC: 3.1 MIL/uL — AB (ref 3.80–5.20)
RDW: 15.2 % — ABNORMAL HIGH (ref 11.5–14.5)
WBC: 6 10*3/uL (ref 3.6–11.0)

## 2017-02-01 LAB — MAGNESIUM: Magnesium: 1.8 mg/dL (ref 1.7–2.4)

## 2017-02-01 MED ORDER — BOOST / RESOURCE BREEZE PO LIQD
1.0000 | Freq: Three times a day (TID) | ORAL | Status: DC
  Administered 2017-02-01 – 2017-02-03 (×5): 1 via ORAL

## 2017-02-01 NOTE — Progress Notes (Signed)
[facility] at [place] NAME: Barbara Chavez    MR#:  [id number]  DATE OF BIRTH:  [birth year]  SUBJECTIVE:   Pt here due to AMS and noted to have Acute on chronic renal failure. Creatinine improved with IV fluids.  REVIEW OF SYSTEMS:    Review of Systems  Unable to perform ROS: Dementia    Nutrition: Heart Healthy Tolerating Diet: Very Little Tolerating PT: Not Ambulatory   DRUG ALLERGIES:  No Known Allergies  VITALS:  Blood pressure 123/86, pulse (!) 119, temperature 98.8 F (37.1 C), temperature source Oral, resp. rate 18, height 5\' 7"  (1.702 m), weight 90.7 kg (200 lb), SpO2 100 %.  PHYSICAL EXAMINATION:   Physical Exam  GENERAL:  83 y.o.-year-old patient lying in bed in no acute distress.  EYES: Pupils equal, round, reactive to light and accommodation. No scleral icterus. Extraocular muscles intact.  HEENT: Head atraumatic, normocephalic. Oropharynx and nasopharynx clear.  NECK:  Supple, no jugular venous distention. No thyroid enlargement, no tenderness.  LUNGS: Normal breath sounds bilaterally, no wheezing, rales, rhonchi. No use of accessory muscles of respiration.  CARDIOVASCULAR: S1, S2 normal. No murmurs, rubs, or gallops.  ABDOMEN: Soft, nontender, nondistended. Bowel sounds present. No organomegaly or mass.  EXTREMITIES: No cyanosis, clubbing or edema b/l.    NEUROLOGIC: Cranial nerves II through XII are intact. No focal Motor or sensory deficits b/l.  Globally weak.  PSYCHIATRIC: The patient is alert and oriented x 1.  SKIN: No obvious rash, lesion, or ulcer.    LABORATORY PANEL:   CBC  Recent Labs Lab 02/01/17 0501  WBC 6.0  HGB 9.0*  HCT 27.0*  PLT 223   ------------------------------------------------------------------------------------------------------------------  Chemistries   Recent Labs Lab 01/31/17 1036 02/01/17 0501  NA 140 144  K 2.2* 2.9*  CL 90* 100*  CO2 38* 35*  GLUCOSE 130* 87  BUN  113* 110*  CREATININE 4.18* 3.28*  CALCIUM 9.8 9.2  MG 1.5* 1.8  AST 32  --   ALT 20  --   ALKPHOS 64  --   BILITOT 0.9  --    ------------------------------------------------------------------------------------------------------------------  Cardiac Enzymes No results for input(s): TROPONINI in the last 168 hours. ------------------------------------------------------------------------------------------------------------------  RADIOLOGY:  Dg Chest Portable 1 View  Result Date: 01/31/2017 CLINICAL DATA:  Altered mental status EXAM: PORTABLE CHEST 1 VIEW COMPARISON:  07/23/2016 chest radiograph. FINDINGS: Stable cardiomediastinal silhouette with top-normal heart size and aortic atherosclerosis. No pneumothorax. No pleural effusion. Faint hazy peripheral left mid lung opacity appears new. Otherwise clear lungs. IMPRESSION: 1. New faint hazy peripheral left mid lung opacity, cannot exclude a developing pneumonia. Recommend follow-up PA and lateral post treatment chest radiographs in 4-6 weeks. 2. Aortic atherosclerosis. Electronically Signed   By: Ilona Sorrel M.D.   On: 01/31/2017 12:01     ASSESSMENT AND PLAN:   83 year old female with past medical history advanced dementia, GERD, osteoarthritis hypertension, hyperlipidemia who presents to the hospital due to altered mental status and noted to be in acute on chronic renal failure.  1. Acute on chronic renal failure-secondary to dehydration and poor by mouth intake due to advanced dementia. -Continue gentle IV fluids, and BUN and creatinine improving. -Baseline creatinine around 2 and currently down to 3.8.  2. Severe hypokalemia-continue with potassium supplementation, Mg. Level normal.  - follow Potassium closely.   3. Depression-continue Lexapro.  4. Hyperlipidemia-continue atorvastatin.  5. GERD-continue Protonix.  Discussed plan of care with Daughter over the phone.   We  will get palliative care consult in the morning to  assist with goals of care as patient may benefit from services at the skilled nursing facility. We'll get physical therapy consult to see if patient needs higher level of care  All the records are reviewed and case discussed with Care Management/Social Worker. Management plans discussed with the patient, family and they are in agreement.  CODE STATUS: DNR  DVT Prophylaxis: Hep. SQ  TOTAL TIME TAKING CARE OF THIS PATIENT: 30 minutes.   POSSIBLE D/C IN 1-2 DAYS, DEPENDING ON CLINICAL CONDITION.   [name] M.D on 02/01/2017 at 1:59 PM  Between 7am to 6pm - Pager - [phone]  After 6pm go to [email or web address] - Proofreader  [facility]  Office  [phone]  CC: Primary care physician; Kirk Ruths., MD

## 2017-02-01 NOTE — Progress Notes (Signed)
Patient has refused medications this morning, stating "no" and turning her head away.  She also became combative by hitting at this RN.  Attempts to reorient patient were unsuccessful.

## 2017-02-01 NOTE — Progress Notes (Signed)
Patient remains confused and is uncooperative with care at times. Oral care performed as patient would allow. Hand mittens were applied to prevent patient from scratching and digging her fingernails into staff when trying to perform care.  Liquids have been offered at each time of rounding.  Only sips consumed.  Patient did drink a small amount of tea and ate a few small bites for lunch when her daughter was present.

## 2017-02-01 NOTE — Progress Notes (Signed)
Initial Nutrition Assessment  DOCUMENTATION CODES:   Severe malnutrition in context of acute illness/injury, Obesity unspecified  INTERVENTION:  Recommend liberalizing diet from Heart Healthy to Regular in setting of altered mental status, poor PO intake, and severe acute malnutrition.  Provide Boost Breeze po TID, each supplement provides 250 kcal and 9 grams of protein.   NUTRITION DIAGNOSIS:   Malnutrition (Severe) related to acute illness, poor appetite (diarrhea) as evidenced by energy intake < or equal to 50% for > or equal to 5 days, 12 percent weight loss over 1 month.  GOAL:   Patient will meet greater than or equal to 90% of their needs  MONITOR:   PO intake, Supplement acceptance, Labs, Weight trends, I & O's  REASON FOR ASSESSMENT:   Malnutrition Screening Tool    ASSESSMENT:   83 year old female with PMHx of HTN, hypercholesterolemia, dementia, lymphoma, history of gastritis presents with diarrhea and poor oral intake. Found to have AKI on CKD, severe hypokalemia and hypomagnesemia, PNA.   -Per chart patient with metabolic encephalopathy. She has been combative and refusing PO medications.   Patient sleeping at time of assessment. Spoke with daughter at bedside. She reports patient began having diarrhea two weeks ago and stopped eating after that. She refuses to eat or drink after only a few bites or sips. She reports she has only been able to get her to take a few bites of gelatin today so far. Daughter reports that soft foods like pudding and applesauce are accepted best. Denies any difficulty chewing/swallowing. She reports prior to this patient had a great appetite and would eat 100% of 3 meals daily with snacks between. Patient was also previously able to ambulate with walker. Daughter reports patient would not like Ensure, but is amenable to having patient try peach Boost Breeze.  Daughter reports that at an appointment in 12/2016 patient was 217 lbs. RD  obtained bed scale weight of 190.9 lbs. Patient has lost 26.1 lbs (12% body weight) over 1 month, which is significant for time frame.  Medications reviewed and include: pantoprazole, Miralax, NS with KCl 40 mEq/L @ 75 ml/hr.  Labs reviewed: Potassium 2.9, Chloride 100, CO2 35, BUN 110, Creatinine 3.23, EGFR 13.   Unable to complete Nutrition-Focused Physical Exam. Daughter reports when patient is touched she has become aggressive towards nurses. Noted patient is in mitts.   Discussed with RN.  Diet Order:  Diet Heart Room service appropriate? Yes; Fluid consistency: Thin  Skin:  Wound (see comment) (MSAD under breasts and skin folds)  Last BM:  01/31/2017  Height:   Ht Readings from Last 1 Encounters:  01/31/17 5' 7"  (1.702 m)    Weight:   Wt Readings from Last 1 Encounters:  02/01/17 190 lb 15.4 oz (86.6 kg)    Ideal Body Weight:  61.4 kg  BMI:  Body mass index is 29.91 kg/m.  Estimated Nutritional Needs:   Kcal:  1600-1870 (MSJ x 1.2-1.4)  Protein:  85-105 grams (1-1.2 grams/kg)  Fluid:  1.6-1.8 L/day  EDUCATION NEEDS:   Education needs no appropriate at this time  Willey Blade, MS, RD, LDN Pager: [phone] After Hours Pager: [phone]

## 2017-02-02 ENCOUNTER — Inpatient Hospital Stay: Payer: Medicare Other

## 2017-02-02 DIAGNOSIS — Z515 Encounter for palliative care: Secondary | ICD-10-CM

## 2017-02-02 DIAGNOSIS — N183 Chronic kidney disease, stage 3 (moderate): Secondary | ICD-10-CM

## 2017-02-02 DIAGNOSIS — Z7189 Other specified counseling: Secondary | ICD-10-CM

## 2017-02-02 DIAGNOSIS — N170 Acute kidney failure with tubular necrosis: Secondary | ICD-10-CM

## 2017-02-02 DIAGNOSIS — F0280 Dementia in other diseases classified elsewhere without behavioral disturbance: Secondary | ICD-10-CM

## 2017-02-02 LAB — BASIC METABOLIC PANEL
ANION GAP: 9 (ref 5–15)
BUN: 84 mg/dL — ABNORMAL HIGH (ref 6–20)
CALCIUM: 9.2 mg/dL (ref 8.9–10.3)
CO2: 35 mmol/L — ABNORMAL HIGH (ref 22–32)
CREATININE: 2.23 mg/dL — AB (ref 0.44–1.00)
Chloride: 105 mmol/L (ref 101–111)
GFR calc non Af Amer: 19 mL/min — ABNORMAL LOW (ref >60)
GFR, EST AFRICAN AMERICAN: 21 mL/min — AB (ref >60)
Glucose, Bld: 92 mg/dL (ref 65–99)
Potassium: 3.2 mmol/L — ABNORMAL LOW (ref 3.5–5.1)
SODIUM: 149 mmol/L — AB (ref 135–145)

## 2017-02-02 MED ORDER — POTASSIUM CL IN DEXTROSE 5% 20 MEQ/L IV SOLN
20.0000 meq | INTRAVENOUS | Status: DC
  Administered 2017-02-02 (×2): 20 meq via INTRAVENOUS
  Filled 2017-02-02 (×4): qty 1000

## 2017-02-02 NOTE — Evaluation (Signed)
Physical Therapy Evaluation Patient Details Name: Barbara Chavez MRN: [id number] DOB: [birth year] Today's Date: 02/02/2017   History of Present Illness  Pt is an 83 y.o. female presenting to hospital with episode of unresponsiveness after breakfast.  Pt with h/o diarrhea about 1 week ago and with AMS and decreased oral intake x1 week.  Pt admitted to hospital with acute metabolic encephalopathy, acute on chronic kidney failure, severe hypokalemia, hypomagnesemia, and hypotension.  PMH includes dementia, htn, and lymphoma in remission.  Clinical Impression  Prior to hospital admission, pt was reportedly ambulatory with walker.  [facility].  Pt requiring extra time and encouragement for participation and once able to get pt to participate, pt required 2 assist to partially roll towards R side before pt started to resist and rolled back supine in bed.  Pt did allow therapists to reposition pt in bed for comfort.  Anticipate pt would do better in home surroundings/with familiar people (will continue to attempt functional mobility during hospital stay).  Recommend pt discharge back to facility when medically appropriate.    Follow Up Recommendations Supervision/Assistance - 24 hour (Return to facility)    Equipment Recommendations   (pt reportedly has walker at facility)    Recommendations for Other Services       Precautions / Restrictions Precautions Precautions: Fall Restrictions Weight Bearing Restrictions: No      Mobility  Bed Mobility Overal bed mobility: Needs Assistance Bed Mobility: Rolling Rolling: Max assist;+2 for physical assistance         General bed mobility comments: able to encourage pt to logroll to R halfway before pt starting to resist and pushing back into supine  Transfers                 General transfer comment: Deferred d/t level of participation  Ambulation/Gait             General Gait Details:  Deferred d/t level of participation  Stairs            Wheelchair Mobility    Modified Rankin (Stroke Patients Only)       Balance                                             Pertinent Vitals/Pain Pain Assessment: Faces Faces Pain Scale: Hurts even more Pain Location: L LE with touch Pain Descriptors / Indicators: Grimacing Pain Intervention(s): Limited activity within patient's tolerance;Monitored during session;Repositioned    Home Living Family/patient expects to be discharged to:: Assisted living ([facility])               Home Equipment:  (Visitors report pt using walker at facility)      Prior Function Level of Independence: Needs assistance   Gait / Transfers Assistance Needed: Pt's visitors report pt ambulating with walker but not sure if she walks with assist or alone.           Hand Dominance        Extremity/Trunk Assessment   Upper Extremity Assessment Upper Extremity Assessment: Difficult to assess due to impaired cognition    Lower Extremity Assessment Lower Extremity Assessment: Difficult to assess due to impaired cognition       Communication   Communication: HOH (Per last PT note, pt hears better on L side)  Cognition  Arousal/Alertness: Awake/alert (Initially with eyes closed but opened eyes with time/encouragement) Behavior During Therapy: Impulsive Overall Cognitive Status: Difficult to assess                                        General Comments  .Nursing cleared pt for participation in physical therapy.  Pt requiring encouragement for participation.  Pt's visitors agreeable to PT session.    Exercises     Assessment/Plan    PT Assessment Patient needs continued PT services  PT Problem List Decreased mobility       PT Treatment Interventions DME instruction;Gait training;Functional mobility training;Therapeutic activities;Therapeutic exercise;Balance  training;Patient/family education    PT Goals (Current goals can be found in the Care Plan section)  Acute Rehab PT Goals Patient Stated Goal: pt did not state any goals    Frequency Min 2X/week   Barriers to discharge        Co-evaluation               End of Session   Activity Tolerance: Patient limited by pain;Other (comment) (Limited d/t level of participation) Patient left: in bed;with call bell/phone within reach;with bed alarm set;with family/visitor present Nurse Communication: Mobility status;Precautions (Pt needing clean-up d/t urinary incontinence) PT Visit Diagnosis: Muscle weakness (generalized) (M62.81);Difficulty in walking, not elsewhere classified (R26.2)    Time: 3662-9476 PT Time Calculation (min) (ACUTE ONLY): 22 min   Charges:   PT Evaluation $PT Eval Low Complexity: 1 Procedure     PT G CodesLeitha Bleak, PT 02/02/17, 3:26 PM [phone]

## 2017-02-02 NOTE — Progress Notes (Signed)
OT Cancellation Note  Patient Details Name: Barbara Chavez MRN: [id number] DOB: 1927/08/29   Cancelled Treatment:    Reason Eval/Treat Not Completed: Medical issues which prohibited therapy. Order received, chart reviewed. Per MD note, pt to get doppler to rule out DVT for LLE. Will hold OT evaluation at this time. Will continue to monitor and re-attempt OT evaluation at later date/time as appropriate.  Jeni Salles, MPH, MS, OTR/L ascom [phone] 02/02/17, 2:44 PM

## 2017-02-02 NOTE — Progress Notes (Signed)
Barbara Chavez at [place] NAME: Barbara Chavez    MR#:  [id number]  DATE OF BIRTH:  05/04/1927  SUBJECTIVE:   Pt here due to AMS and noted to have Acute on chronic renal failure. Creatinine improving with IV fluids. Still complaining of some LLE pain. Family at bedside  REVIEW OF SYSTEMS:    Review of Systems  Unable to perform ROS: Dementia    Nutrition: Heart Healthy Tolerating Diet: Very Little Tolerating PT: Not Ambulatory   DRUG ALLERGIES:  No Known Allergies  VITALS:  Blood pressure (!) 124/47, pulse 72, temperature 98.7 F (37.1 C), temperature source Oral, resp. rate 16, height 5\' 7"  (1.702 m), weight 86.6 kg (190 lb 15.4 oz), SpO2 100 %.  PHYSICAL EXAMINATION:   Physical Exam  GENERAL:  83 y.o.-year-old patient lying in bed in no acute distress.  EYES: Pupils equal, round, reactive to light and accommodation. No scleral icterus. Extraocular muscles intact.  HEENT: Head atraumatic, normocephalic. Oropharynx and nasopharynx clear.  NECK:  Supple, no jugular venous distention. No thyroid enlargement, no tenderness.  LUNGS: Normal breath sounds bilaterally, no wheezing, rales, rhonchi. No use of accessory muscles of respiration.  CARDIOVASCULAR: S1, S2 normal. No murmurs, rubs, or gallops.  ABDOMEN: Soft, nontender, nondistended. Bowel sounds present. No organomegaly or mass.  EXTREMITIES: No cyanosis, clubbing, + 1 LLE edeam > right. Some pain on palpation of LLE.   NEUROLOGIC: Cranial nerves II through XII are intact. No focal Motor or sensory deficits b/l.  Globally weak.  PSYCHIATRIC: The patient is alert and oriented x 1.  SKIN: No obvious rash, lesion, or ulcer.    LABORATORY PANEL:   CBC  Recent Labs Lab 02/01/17 0501  WBC 6.0  HGB 9.0*  HCT 27.0*  PLT 223   ------------------------------------------------------------------------------------------------------------------  Chemistries   Recent Labs Lab  01/31/17 1036 02/01/17 0501 02/02/17 0418  NA 140 144 149*  K 2.2* 2.9* 3.2*  CL 90* 100* 105  CO2 38* 35* 35*  GLUCOSE 130* 87 92  BUN 113* 110* 84*  CREATININE 4.18* 3.28* 2.23*  CALCIUM 9.8 9.2 9.2  MG 1.5* 1.8  --   AST 32  --   --   ALT 20  --   --   ALKPHOS 64  --   --   BILITOT 0.9  --   --    ------------------------------------------------------------------------------------------------------------------  Cardiac Enzymes No results for input(s): TROPONINI in the last 168 hours. ------------------------------------------------------------------------------------------------------------------  RADIOLOGY:  No results found.   ASSESSMENT AND PLAN:   83 year old female with past medical history advanced dementia, GERD, osteoarthritis hypertension, hyperlipidemia who presents to the hospital due to altered mental status and noted to be in acute on chronic renal failure.  1. Acute on chronic renal failure-secondary to dehydration and poor by mouth intake due to advanced dementia. -Continue gentle IV fluids, and BUN and creatinine improving. -Baseline creatinine around 2 and currently down to 2.2 today.  2. Severe hypokalemia- improving w/ supplementation, Mg. Level normal.  - follow Potassium  3. Hypernatremia - will switch IV fluids to D5W and repeat sodium in a.m.   4. Depression-continue Lexapro.  5. Hyperlipidemia-continue atorvastatin.  6. GERD-continue Protonix.  7. LLE swelling/pain - will get doppler to r/o DVT.   Appreciate palliative care evaluation. Likely discharged to [facility] with palliative care follow-up.  All the records are reviewed and case discussed with Care Management/Social Worker. Management plans discussed with the patient, family and they are in agreement.  CODE STATUS: DNR  DVT Prophylaxis: Hep. SQ  TOTAL TIME TAKING CARE OF THIS PATIENT: 30 minutes.   POSSIBLE D/C IN 1-2 DAYS, DEPENDING ON CLINICAL CONDITION.   Barbara Chavez  M.D on 02/02/2017 at 2:30 PM  Between 7am to 6pm - Pager - [phone]  After 6pm go to [email or web address] - Proofreader  [facility]  Office  [phone]  CC: Primary care physician; Kirk Ruths., MD

## 2017-02-02 NOTE — Consult Note (Signed)
Date: 02/02/2017                  Patient Name:  Barbara Chavez  MRN: [id number]  DOB: 1927-11-07  Age / Sex: 83 y.o., female         PCP: Kirk Ruths., MD                 Service Requesting Consult: IM                 Reason for Consult: ARF            History of Present Illness: Patient is a 83 y.o. female with medical problems of dementia, lymphoma in remission, hypertension and hyperlipidemia, who was admitted to [facility] on 01/31/2017 for evaluation of altered mental status.  [facility] She presented via EMS because she became unresponsive after breakfast.  She was hypotensive and evaluated by EMS at 86/36 Upon admission, creatinine was 4.18, BUN 113  Potassium was low at 2.2 With IV hydration and electrolyte replacement, serum creatinine has improved 2.23 today.  Potassium is improved to 3.2  Baseline creatinine appears to be 1.17 from October 2016  Medications: Outpatient medications: Prescriptions Prior to Admission  Medication Sig Dispense Refill Last Dose  . aspirin EC 81 MG tablet Take 81 mg by mouth daily.   01/28/2017 at 0800  . atorvastatin (LIPITOR) 40 MG tablet Take 40 mg by mouth at bedtime.   01/27/2017 at 2000  . busPIRone (BUSPAR) 15 MG tablet Take 15 mg by mouth 3 (three) times daily.    01/28/2017 at 0800  . Calcium Carbonate (CALCIUM-CARB 600 PO) Take 600 mg by mouth daily.   01/28/2017 at 0800  . Cholecalciferol (VITAMIN D3) 2000 units capsule Take 2,000 Units by mouth daily.   01/28/2017 at 0800  . cloNIDine (CATAPRES) 0.2 MG tablet Take 0.2 mg by mouth 2 (two) times daily.   01/28/2017 at 0800  . escitalopram (LEXAPRO) 20 MG tablet Take 20 mg by mouth daily.   01/28/2017 at 0800  . fluticasone (FLONASE) 50 MCG/ACT nasal spray Place 2 sprays into both nostrils daily.    01/28/2017 at 0800  . labetalol (NORMODYNE) 300 MG tablet Take 600 mg by mouth 2 (two) times daily.   01/28/2017 at 0800  . metolazone  (ZAROXOLYN) 5 MG tablet Take 5 mg by mouth 2 (two) times daily.   01/28/2017 at 0800  . polyethylene glycol (MIRALAX / GLYCOLAX) packet Take 17 g by mouth daily.   01/27/2017 at 2000  . QUEtiapine (SEROQUEL) 25 MG tablet Take 25 mg by mouth at bedtime.   01/27/2017 at 2000  . torsemide (DEMADEX) 10 MG tablet Take 10 mg by mouth daily.    01/28/2017 at 0800  . traMADol (ULTRAM) 50 MG tablet Take 50 mg by mouth every 6 (six) hours as needed for moderate pain.    01/28/2017 at UNKNOWN  . traZODone (DESYREL) 100 MG tablet Take 100 mg by mouth.    01/27/2017 at 2000  . pantoprazole (PROTONIX) 40 MG tablet Take 40 mg by mouth daily.    01/28/2017 at 0800    Current medications: Current Facility-Administered Medications  Medication Dose Route Frequency Provider Last Rate Last Dose  . acetaminophen (TYLENOL) tablet 650 mg  650 mg Oral Q6H PRN Demetrios Loll, MD       Or  . acetaminophen (TYLENOL) suppository 650 mg  650 mg Rectal Q6H PRN Demetrios Loll, MD      .  albuterol (PROVENTIL) (2.5 MG/3ML) 0.083% nebulizer solution 2.5 mg  2.5 mg Nebulization Q2H PRN Demetrios Loll, MD      . aspirin EC tablet 81 mg  81 mg Oral Daily Demetrios Loll, MD   81 mg at 02/02/17 1028  . atorvastatin (LIPITOR) tablet 40 mg  40 mg Oral QHS Demetrios Loll, MD   40 mg at 02/01/17 2147  . bisacodyl (DULCOLAX) EC tablet 5 mg  5 mg Oral Daily PRN Demetrios Loll, MD      . busPIRone (BUSPAR) tablet 15 mg  15 mg Oral TID Demetrios Loll, MD   15 mg at 02/02/17 1619  . dextrose 5 % with KCl 20 mEq / L  infusion  20 mEq Intravenous Continuous [name], MD 75 mL/hr at 02/02/17 1039 20 mEq at 02/02/17 1039  . escitalopram (LEXAPRO) tablet 20 mg  20 mg Oral Daily Demetrios Loll, MD   20 mg at 02/02/17 1027  . feeding supplement (BOOST / RESOURCE BREEZE) liquid 1 Container  1 Container Oral TID BM [name], MD   1 Container at 02/02/17 1431  . fluticasone (FLONASE) 50 MCG/ACT nasal spray 2 spray  2 spray Each Nare Daily Demetrios Loll, MD   2 spray at 02/02/17 1042   . heparin injection 5,000 Units  5,000 Units Subcutaneous Q8H Demetrios Loll, MD   5,000 Units at 02/02/17 1431  . HYDROcodone-acetaminophen (NORCO/VICODIN) 5-325 MG per tablet 1-2 tablet  1-2 tablet Oral Q4H PRN Demetrios Loll, MD   1 tablet at 01/31/17 2152  . ondansetron (ZOFRAN) tablet 4 mg  4 mg Oral Q6H PRN Demetrios Loll, MD       Or  . ondansetron [facility]) injection 4 mg  4 mg Intravenous Q6H PRN Demetrios Loll, MD      . pantoprazole (PROTONIX) EC tablet 40 mg  40 mg Oral Daily Demetrios Loll, MD   40 mg at 02/02/17 1027  . polyethylene glycol (MIRALAX / GLYCOLAX) packet 17 g  17 g Oral QPM Demetrios Loll, MD   Stopped at 02/01/17 2123  . QUEtiapine (SEROQUEL) tablet 25 mg  25 mg Oral QHS Demetrios Loll, MD   25 mg at 02/01/17 2147  . senna-docusate (Senokot-S) tablet 1 tablet  1 tablet Oral QHS PRN Demetrios Loll, MD      . traZODone (DESYREL) tablet 100 mg  100 mg Oral QHS Demetrios Loll, MD   100 mg at 02/01/17 2147      Allergies: No Known Allergies    Past Medical History: Past Medical History:  Diagnosis Date  . Cancer ([place])   . Dementia   . Hypercholesterolemia   . Hypertension   . Lymphoma ([place])   . Osteoarthritis   . Reflux      Past Surgical History: Past Surgical History:  Procedure Laterality Date  . CATARACT EXTRACTION    . KNEE ARTHROSCOPY       Family History: Family History  Problem Relation Age of Onset  . Cancer Sister   . Breast cancer Sister      Social History: Social History   Social History  . Marital status: Widowed    Spouse name: N/A  . Number of children: N/A  . Years of education: N/A   Occupational History  . Not on file.   Social History Main Topics  . Smoking status: Never Smoker  . Smokeless tobacco: Never Used  . Alcohol use No  . Drug use: No  . Sexual activity: Not Currently  Other Topics Concern  . Not on file   Social History Narrative  . No narrative on file     Review of Systems:not reliable due to dementia Gen:  HEENT:  CV:  Resp:   GI: GU :  MS:  Derm:   Psych: Heme:  Neuro:  Endocrine  Vital Signs: Blood pressure (!) 124/47, pulse 72, temperature 98.7 F (37.1 C), temperature source Oral, resp. rate 16, height 5\' 7"  (1.702 m), weight 86.6 kg (190 lb 15.4 oz), SpO2 100 %.   Intake/Output Summary (Last 24 hours) at 02/02/17 1659 Last data filed at 02/02/17 1600  Gross per 24 hour  Intake             2692 ml  Output                0 ml  Net             2692 ml    Weight trends: Filed Weights   01/31/17 1001 02/01/17 1409  Weight: 90.7 kg (200 lb) 86.6 kg (190 lb 15.4 oz)    Physical Exam: General:  elderly frail woman, laying in the bed  HEENT Dry oral mucous membranes, anicteric  Neck:  supple  Lungs: Normal breathing effort, clear to auscultation  Heart:: Irregular, no rub  Abdomen: Soft, nontender  Extremities:  trace to 1+ edema  Neurologic: Alert, able to answer a few questions  Skin: No acute rashes             Lab results: Basic Metabolic Panel:  Recent Labs Lab 01/31/17 1036 02/01/17 0501 02/02/17 0418  NA 140 144 149*  K 2.2* 2.9* 3.2*  CL 90* 100* 105  CO2 38* 35* 35*  GLUCOSE 130* 87 92  BUN 113* 110* 84*  CREATININE 4.18* 3.28* 2.23*  CALCIUM 9.8 9.2 9.2  MG 1.5* 1.8  --     Liver Function Tests:  Recent Labs Lab 01/31/17 1036  AST 32  ALT 20  ALKPHOS 64  BILITOT 0.9  PROT 7.3  ALBUMIN 3.5   No results for input(s): LIPASE, AMYLASE in the last 168 hours. No results for input(s): AMMONIA in the last 168 hours.  CBC:  Recent Labs Lab 01/31/17 1036 02/01/17 0501  WBC 7.5 6.0  HGB 9.1* 9.0*  HCT 27.2* 27.0*  MCV 86.0 86.9  PLT 258 223    Cardiac Enzymes: No results for input(s): CKTOTAL, TROPONINI in the last 168 hours.  BNP: Invalid input(s): POCBNP  CBG: No results for input(s): GLUCAP in the last 168 hours.  Microbiology: No results found for this or any previous visit (from the past 720 hour(s)).   Coagulation Studies: No results  for input(s): LABPROT, INR in the last 72 hours.  Urinalysis:  Recent Labs  01/31/17 1036  COLORURINE YELLOW*  LABSPEC 1.010  PHURINE 6.0  GLUCOSEU NEGATIVE  HGBUR NEGATIVE  BILIRUBINUR NEGATIVE  KETONESUR NEGATIVE  PROTEINUR NEGATIVE  NITRITE NEGATIVE  LEUKOCYTESUR NEGATIVE        Imaging:  No results found.   Assessment & Plan: Patient is a 83 y.o. African American female with medical problems of dementia, lymphoma in remission, hypertension and hyperlipidemia, who was admitted to [facility] on 01/31/2017 for evaluation of altered mental status.   1.  Acute renal failure likely secondary to volume depletion and dehydration Serum creatinine and BUN now improving with IV hydration Continue current management  2.  Hypokalemia Likely from use of diuretics including torsemide and metolazone Currently on hold  Currently getting IV replacement  Will follow

## 2017-02-02 NOTE — Consult Note (Signed)
Consultation Note Date: 02/02/2017   Patient Name: Barbara Chavez  DOB: September 27, 1927  MRN: [id number]  Age / Sex: 83 y.o., female  PCP: Kirk Ruths, MD Referring Physician: [name], MD  Reason for Consultation: Establishing goals of care, Hospice Evaluation and Psychosocial/spiritual support  HPI/Patient Profile: 83 y.o. female  with past medical history of dementia, follicular lymphoma, CKD and gastritis who was admitted on 01/31/2017 with acute on chronic renal failure, possible pneumonia and altered mental status.  The patient was found to be dehydrated with a GFR of 10.  She had been having diarrhea at the ALF.  Per her daughter, the diarrhea was viral and passed thru the whole assisted living facility.     Clinical Assessment and Goals of Care:  I have reviewed medical records including EPIC notes, labs and imaging, received report from Dr. Verdell Carmine, assessed the patient and then met at the bedside along with Barbara Chavez (dtr) and Hilda Blades (friend)  to discuss diagnosis prognosis, [place], EOL wishes, disposition and options.  I introduced Palliative Medicine as specialized medical care for people living with serious illness. It focuses on providing relief from the symptoms and stress of a serious illness. The goal is to improve quality of life for both the patient and the family.  We discussed a brief life review of the patient.  Then I assessed functional and nutritional status at home by gathering history. She moved into the ALF 1 month ago at the suggestion of her family doctor.  She was walking with a walker, eating well and conversant.  She had not adjusted to the ALF yet as she had lived in her home for 60 years.  Her husband died in Dec 29, 2013 from dementia and she was his primary care taker.  Six months after he passed- she was diagnosed with dementia.  She has 1 child Barbara Chavez who is her primary care  taker and medical surrogate.  Barbara Chavez also has significant health problems/frequent hospitalizations, but she works full time at [employer].   We discussed their current illness and what it means in the larger context of their on-going co-morbidities.  Natural disease trajectory and expectations at EOL were discussed. Specifically we discussed her renal failure.  Many patients with dementia get into the cycle of not wanting to eat or drink - they become dehydrated, fail and come into the hospital.  Barbara Chavez does not want this for her mother.  She really does not like the ER and would like to avoid it at all costs.   At this point Barbara Chavez is not convinced that we have started the cycle of not wanting to eat/drink.  She believes that once the viral illness is improved - her mother will want to eat again.  We also discussed possible aspiration pneumonia.  I explained the concept of silent aspiration.  Barbara Chavez seemed to truly understand how recurrent episodes of pneumonia could wear a body down.  Overall Barbara Chavez has an excellent understanding of dementia as she lived thru it with her father.  We completed a MOST form.  A copy is on the hard chart.  Barbara Chavez elected limited interventions, no PEG, +IVF, +antibiotics.  If she sees her mother in a recurrent cycle of dehydration or aspiration - she will change the form to reflect comfort measures only.  Finally we talked about her mother's lymphoma.  She received rituxan 2 years ago and has not required further treatment.  Barbara Chavez asks for Dr. Kennith Gain to see her mother while she is here for possible blood work and IV iron transfusion.  Hospice and Palliative Care services outpatient were explained and offered.  As she gets closer to discharge we will decide the level of support based on where she is being discharged to and what her condition is at the time.   Ex.  ALF with Palliative vs Hospice (if she declines) or SNF with Palliative (if she is able to take  rehab).  Questions and concerns were addressed.  PMT will continue to support holistically.   Primary Decision Maker:  [name]    SUMMARY OF RECOMMENDATIONS     Consider LLE doppler (leg has chronic edema, but is swollen and tender)  Will order PT / OT  As patient was walking prior to admission  Will order SLP to rule out aspiration and recommend diet  Likely to SNF with Palliative Care follow up.  I will touch base with Dr. Kennith Gain at family's request regarding anemia and labs.  Code Status/Advance Care Planning:  DNR    Symptom Management:   Per primary team  Additional Recommendations (Limitations, Scope, Preferences):  Minimize Medications and No Artificial Feeding  Palliative Prophylaxis:   Aspiration  Prognosis:   < 6 months based on acute on chronic renal failure, dementia, falls, aspiration, infection  Discharge Planning: [place] for rehab with Palliative care service follow-up      Primary Diagnoses: Present on Admission: . Renal failure (ARF), acute on chronic ([place])   I have reviewed the medical record, interviewed the patient and family, and examined the patient. The following aspects are pertinent.  Past Medical History:  Diagnosis Date  . Cancer ([place])   . Dementia   . Hypercholesterolemia   . Hypertension   . Lymphoma ([place])   . Osteoarthritis   . Reflux    Social History   Social History  . Marital status: Widowed    Spouse name: N/A  . Number of children: N/A  . Years of education: N/A   Social History Main Topics  . Smoking status: Never Smoker  . Smokeless tobacco: Never Used  . Alcohol use No  . Drug use: No  . Sexual activity: Not Currently   Other Topics Concern  . None   Social History Narrative  . None   Family History  Problem Relation Age of Onset  . Cancer Sister   . Breast cancer Sister    Scheduled Meds: . aspirin EC  81 mg Oral Daily  . atorvastatin  40 mg Oral QHS  .  busPIRone  15 mg Oral TID  . escitalopram  20 mg Oral Daily  . feeding supplement  1 Container Oral TID BM  . fluticasone  2 spray Each Nare Daily  . heparin  5,000 Units Subcutaneous Q8H  . pantoprazole  40 mg Oral Daily  . polyethylene glycol  17 g Oral QPM  . QUEtiapine  25 mg Oral QHS  . traZODone  100 mg Oral QHS   Continuous Infusions: . dextrose 5 % with KCl  20 mEq / L 20 mEq (02/02/17 1039)   PRN Meds:.acetaminophen **OR** acetaminophen, albuterol, bisacodyl, HYDROcodone-acetaminophen, ondansetron **OR** ondansetron (ZOFRAN) IV, senna-docusate No Known Allergies Review of Systems patient is pleasantly demented  Physical Exam  Well developed, pleasantly demented female, spitting out her pills CV rrr,  Resp NAD Abd soft NT, Nd LLE tender and swollen 2+  Vital Signs: BP (!) 124/47 (BP Location: Left Arm)   Pulse 72   Temp 98.7 F (37.1 C) (Oral)   Resp 16   Ht _0  (1.702 m)   Wt 86.6 kg (190 lb 15.4 oz) Comment: bed scale, 2 blankets  SpO2 100%   BMI 29.91 kg/m  Pain Assessment: 0-10   Pain Score: Asleep   SpO2: SpO2: 100 % O2 Device:SpO2: 100 % O2 Flow Rate: .   IO: Intake/output summary:  Intake/Output Summary (Last 24 hours) at 02/02/17 1148 Last data filed at 02/02/17 0900  Gross per 24 hour  Intake          2240.75 ml  Output                0 ml  Net          2240.75 ml    LBM: Last BM Date: 01/31/17 Baseline Weight: Weight: 90.7 kg (200 lb) Most recent weight: Weight: 86.6 kg (190 lb 15.4 oz) (bed scale, 2 blankets)     Palliative Assessment/Data:   Flowsheet Rows     Most Recent Value  Intake Tab  Referral Department  Hospitalist  Unit at Time of Referral  Med/Surg Unit  Palliative Care Primary Diagnosis  Neurology  Date Notified  02/02/17  Palliative Care Type  New Palliative care  Reason for referral  Clarify Goals of Care, Psychosocial or Spiritual support  Date of Admission  01/31/17  Date first seen by Palliative Care  02/02/17   # of days Palliative referral response time  0 Day(s)  # of days IP prior to Palliative referral  2  Clinical Assessment  Palliative Performance Scale Score  30%  Psychosocial & Spiritual Assessment  Palliative Care Outcomes  Patient/Family meeting held?  Yes  Who was at the meeting?  patient and dtr  Palliative Care Outcomes  Clarified goals of care, Counseled regarding hospice, Provided psychosocial or spiritual support  Patient/Family wishes: Interventions discontinued/not started   PEG      Time In: 10:30 Time Out: 12:00 Time Total: 90 min. Greater than 50%  of this time was spent counseling and coordinating care related to the above assessment and plan.  Signed by: Imogene Burn, PA-C Palliative Medicine Pager: [phone]  Please contact Palliative Medicine Team phone at [phone] for questions and concerns.  For individual provider: See Shea Evans

## 2017-02-02 NOTE — Plan of Care (Signed)
Problem: Skin Integrity: Goal: Risk for impaired skin integrity will decrease Outcome: Progressing Educated the patient on the need to keep her clean and dry so hr skin does not breakdown

## 2017-02-02 NOTE — Progress Notes (Signed)
No charge note  PMT meeting scheduled at 10:30.  Imogene Burn, [place] [facility] [phone]

## 2017-02-02 NOTE — Progress Notes (Signed)
Speech Therapy Note: reviewed chart notes; consulted NSG then met w/ pt and caregivers present in room. Caregiver stated pt awakened briefly w/ her earlier and took ~small tsps of foods then few small sips of juice and tea but then refused all other offers of foods/drinks. Pt continues to present w/ declined Cognitive status preferring to sleep and does not awaken easily. Pt did not want any trials(for BSE) upon encouragement.  ST services will f/u w/ assessment tomorrow as pt participates. Recommend continue w/ Dysphagia level 2 diet w/ thin liquids w/ strict aspiration precautions. Caregivers and NSG updated; agreed verbally.   Orinda Kenner, [place], CCC-SLP

## 2017-02-02 NOTE — Progress Notes (Signed)
SLP Cancellation Note  Patient Details Name: Barbara Chavez MRN: [id number] DOB: 05-13-1927   Cancelled treatment:       Reason Eval/Treat Not Completed: Patient declined, no reason specified. Chart reviewed, NA consulted. Pt resting but awakened with verbal/tactile stim. Pt moderately/severely confused; mitts placed on hands. SLP attempted oral care, however oral care only agitated pt. PO trials for BSE were offered, however pt adamantly refused despite max encouragement. Pt did agree to "try" later.   D/t pt's declined cognitive status, diet was modified for dysphagia 2 with thins with strict aspiration precautions. SLP will return in the afternoon for BSE if pt permits. Sign posted in room.  NSG updated.    Eulogio Ditch, B.S Graduate Clinician  02/02/2017, 10:03 AM

## 2017-02-03 DIAGNOSIS — N179 Acute kidney failure, unspecified: Principal | ICD-10-CM

## 2017-02-03 LAB — BASIC METABOLIC PANEL
ANION GAP: 6 (ref 5–15)
BUN: 53 mg/dL — ABNORMAL HIGH (ref 6–20)
CHLORIDE: 105 mmol/L (ref 101–111)
CO2: 34 mmol/L — ABNORMAL HIGH (ref 22–32)
Calcium: 9.1 mg/dL (ref 8.9–10.3)
Creatinine, Ser: 1.6 mg/dL — ABNORMAL HIGH (ref 0.44–1.00)
GFR calc Af Amer: 32 mL/min — ABNORMAL LOW (ref >60)
GFR, EST NON AFRICAN AMERICAN: 28 mL/min — AB (ref >60)
Glucose, Bld: 129 mg/dL — ABNORMAL HIGH (ref 65–99)
POTASSIUM: 3.8 mmol/L (ref 3.5–5.1)
SODIUM: 145 mmol/L (ref 135–145)

## 2017-02-03 LAB — CBC
HCT: 27.8 % — ABNORMAL LOW (ref 35.0–47.0)
Hemoglobin: 9 g/dL — ABNORMAL LOW (ref 12.0–16.0)
MCH: 28.5 pg (ref 26.0–34.0)
MCHC: 32.4 g/dL (ref 32.0–36.0)
MCV: 87.9 fL (ref 80.0–100.0)
PLATELETS: 215 10*3/uL (ref 150–440)
RBC: 3.16 MIL/uL — ABNORMAL LOW (ref 3.80–5.20)
RDW: 15.4 % — AB (ref 11.5–14.5)
WBC: 5.2 10*3/uL (ref 3.6–11.0)

## 2017-02-03 LAB — IRON AND TIBC
IRON: 36 ug/dL (ref 28–170)
SATURATION RATIOS: 12 % (ref 10.4–31.8)
TIBC: 296 ug/dL (ref 250–450)
UIBC: 260 ug/dL

## 2017-02-03 LAB — FERRITIN: FERRITIN: 30 ng/mL (ref 11–307)

## 2017-02-03 MED ORDER — TRAMADOL HCL 50 MG PO TABS: 50.0000 mg | ORAL_TABLET | Freq: Four times a day (QID) | ORAL | 0 refills | Status: AC | PRN

## 2017-02-03 NOTE — Progress Notes (Signed)
Daily Progress Note   Patient Name: Barbara Chavez       Date: 02/03/2017 DOB: [birth year]  Age: 83 y.o. MRN#: [id number] Attending Physician: [name], MD Primary Care Physician: Kirk Ruths., MD Admit Date: 01/31/2017  Reason for Consultation/Follow-up: Establishing goals of care and Psychosocial/spiritual support  Subjective: Patient sleeping.  Still in hand restraints ([place]).  Dtr at bedside distressed because she feels her mother is not yet back to baseline mentally or physically.  Mrs. Haigler is likely being discharged today to SNF.  Marisa requests that I follow up with Dr. Grayland Ormond regarding IV iron and other labs that are needed.  I spoke with him and he suggested an iron panel and ferritin would be helpful.   Assessment: 83 yo female with dementia, anemia, and follicular lymphoma (in remission).  A recent viral illness caused acute renal failure from which she is now recovering.  The patient was able to ambulate prior to admission and according to her dtr was eating well.  Now she is able to bear weight with 2 person assist and is resisting POs.  She is being discharged to SNF to continue her recovery.   Patient Profile/HPI: 83 y.o. female  with past medical history of dementia, follicular lymphoma, CKD and gastritis who was admitted on 01/31/2017 with acute on chronic renal failure, possible pneumonia and altered mental status.  The patient was found to be dehydrated with a GFR of 10.  She had been having diarrhea at the ALF.  Per her daughter, the diarrhea was viral and passed thru the whole assisted living facility.    Length of Stay: 3  Current Medications: Scheduled Meds:  . aspirin EC  81 mg Oral Daily  . atorvastatin  40 mg Oral QHS  . busPIRone  15 mg  Oral TID  . escitalopram  20 mg Oral Daily  . feeding supplement  1 Container Oral TID BM  . fluticasone  2 spray Each Nare Daily  . heparin  5,000 Units Subcutaneous Q8H  . pantoprazole  40 mg Oral Daily  . polyethylene glycol  17 g Oral QPM  . QUEtiapine  25 mg Oral QHS  . traZODone  100 mg Oral QHS    Continuous Infusions: . dextrose 5 % with KCl 20 mEq / L 20 mEq (02/02/17 2117)  PRN Meds: acetaminophen **OR** acetaminophen, albuterol, bisacodyl, HYDROcodone-acetaminophen, ondansetron **OR** ondansetron (ZOFRAN) IV, senna-docusate  Physical Exam        Well developed, pleasantly demented female, NAD Resp NAD, CTA CV rrr   Vital Signs: BP (!) 154/68 (BP Location: Left Arm)   Pulse 70   Temp 98.1 F (36.7 C) (Oral)   Resp 18   Ht 5\' 7"  (1.702 m)   Wt 86.6 kg (190 lb 15.4 oz) Comment: bed scale, 2 blankets  SpO2 99%   BMI 29.91 kg/m  SpO2: SpO2: 99 % O2 Device: O2 Device: Not Delivered O2 Flow Rate:    Intake/output summary:  Intake/Output Summary (Last 24 hours) at 02/03/17 1150 Last data filed at 02/03/17 0086  Gross per 24 hour  Intake          1701.25 ml  Output                0 ml  Net          1701.25 ml   LBM: Last BM Date: 01/31/17 Baseline Weight: Weight: 90.7 kg (200 lb) Most recent weight: Weight: 86.6 kg (190 lb 15.4 oz) (bed scale, 2 blankets)       Palliative Assessment/Data:    Flowsheet Rows     Most Recent Value  Intake Tab  Referral Department  Hospitalist  Unit at Time of Referral  Med/Surg Unit  Palliative Care Primary Diagnosis  Neurology  Date Notified  02/02/17  Palliative Care Type  New Palliative care  Reason for referral  Clarify Goals of Care, Psychosocial or Spiritual support  Date of Admission  01/31/17  Date first seen by Palliative Care  02/02/17  # of days Palliative referral response time  0 Day(s)  # of days IP prior to Palliative referral  2  Clinical Assessment  Palliative Performance Scale Score  30%    Psychosocial & Spiritual Assessment  Palliative Care Outcomes  Patient/Family meeting held?  Yes  Who was at the meeting?  patient and dtr  Palliative Care Outcomes  Clarified goals of care, Counseled regarding hospice, Provided psychosocial or spiritual support  Patient/Family wishes: Interventions discontinued/not started   PEG      Patient Active Problem List   Diagnosis Date Noted  . Dementia due to medical condition without behavioral disturbance   . Palliative care encounter   . Goals of care, counseling/discussion   . Renal failure (ARF), acute on chronic ([place]) 01/31/2017  . Hyponatremia 06/27/2015  . Follicular non-Hodgkin's lymphoma ([place]) 05/24/2015  . Adenopathy 05/03/2015  . Anemia 04/20/2015  . 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 Humerus lesion, right 04/20/2015    Palliative Care Plan    Recommendations/Plan:  Will order iron panel and ferritin at the suggestion of Oncology prior to d/c  MOST form completed and on chart  D/C to SNF with Palliative to follow  At this point family wants patient to come to hospital if needed, but their focus is definitely on quality of life over quantity.  If patient continues to refuse POs and again becomes dehydrated -a hospice evaluation would be appropriate.  Dtr needs support resources / help with her mother.   Goals of Care and Additional Recommendations:  Limitations on Scope of Treatment: Full Scope Treatment  Code Status:  DNR  Prognosis:   Unable to determine Likely 6 months or less if she does not increase PO intake or become mobile again.   Discharge Planning:  [facility] for rehab with  Palliative care service follow-up  Care plan was discussed with bedside RN, CSW, dtr.  Thank you for allowing the Palliative Medicine Team to assist in the care of this patient.  Total time spent:  35 min.     Greater than 50%  of this time was spent counseling and coordinating care related to the above  assessment and plan.  Imogene Burn, PA-C Palliative Medicine  Please contact Palliative MedicineTeam phone at [phone] for questions and concerns between 7 am - 7 pm.   Please see AMION for individual provider pager numbers.

## 2017-02-03 NOTE — Progress Notes (Signed)
Dr. Verdell Carmine at the bedside to see patient

## 2017-02-03 NOTE — Clinical Social Work Note (Addendum)
CSW spoke to patient's daughter this morning and she had concerns that patient would not be able to return to her family care home due to not ambulating. CSW spent a lot of time with patient's daughter in patient's room explaining that patient had not been very cooperative with PT and that she had been refusing her medications at times. CSW offered to have PT return today while she was here and see if patient would work with them with her present. As a result, PT was able to get patient up to the chair with a lot of coaxing and patient's daughter had to intervene to get patient to be cooperative as patient pinched the PT assistant. CSW informed patient's daughter that a bed search can be initiated and that when she gets to the facility that they choose, if patient is not cooperative with staff, then patient will more than likely be discharged as private insurance will not pay if patient is not progressing and working with PT. Patient's daughter verbalized understanding. Bed search was done and patient's daughter chose [facility]. Discharge information sent to [facility] and nurse to call report. Patient's daughter requests transport via ems. Shela Leff MSW,LcSW [phone]

## 2017-02-03 NOTE — Care Management Important Message (Signed)
Important Message  Patient Details  Name: Barbara Chavez MRN: [id number] Date of Birth: [birth year]   Medicare Important Message Given:  Yes    Beverly Sessions, RN 02/03/2017, 2:25 PM

## 2017-02-03 NOTE — Progress Notes (Signed)
OT Cancellation Note  Patient Details Name: Maebry Obrien MRN: [id number] DOB: 1927-05-29   Cancelled Treatment:    Reason Eval/Treat Not Completed: Other (comment). Chart reviewed. Doppler revealed no DVT. Attempted OT evaluation, pt with Nsg eating breakfast. Will re-attempt OT evaluation at later time to support maximal participation.  Corky Sox, OTR/L 02/03/2017, 9:02 AM

## 2017-02-03 NOTE — Progress Notes (Signed)
qPhysical Therapy Treatment Patient Details Name: Barbara Chavez MRN: [id number] DOB: 1927-01-21 Today's Date: 02/03/2017    History of Present Illness Pt is an 83 y.o. female presenting to hospital with episode of unresponsiveness after breakfast.  Pt with h/o diarrhea about 1 week ago and with AMS and decreased oral intake x1 week.  Pt admitted to hospital with acute metabolic encephalopathy, acute on chronic kidney failure, severe hypokalemia, hypomagnesemia, and hypotension.  PMH includes dementia, htn, and lymphoma in remission.    PT Comments    Pt demonstrating improved participation today (pt's daughter present encouraging pt) and able to progress to taking a few steps bed to chair with 2 assist and use of RW.  Pt sitting too close to edge of bed at one point requiring 2 assist to scoot pt back further onto bed for safety and pt then became agitated pinching therapy aide but pt's daughter able to calm pt down and pt continued to participate and then take steps to chair (SW and nursing notified).  Pt's daughter expressing concerns regarding pt's current assist levels and ability to safely return to facility; PT discussed concerns with pt and pt's daughter regarding unfamiliar environment/staff at [facility] and pt's ability to consistently participate in therapy.  D/t pt's improved participation levels today, pt would benefit from trial at [facility]   Follow Up Recommendations  SNF     Equipment Recommendations   (pt reportedly has walker at facility)    Recommendations for Other Services       Precautions / Restrictions Precautions Precautions: Fall Restrictions Weight Bearing Restrictions: No    Mobility  Bed Mobility Overal bed mobility: Needs Assistance Bed Mobility: Supine to Sit     Supine to sit: Max assist;+2 for physical assistance     General bed mobility comments: assist for trunk and B LE's; vc's and encouragement to perform  Transfers Overall transfer level:  Needs assistance Equipment used: Rolling walker (2 wheeled) Transfers: Sit to/from Stand Sit to Stand: Mod assist;+2 physical assistance         General transfer comment: x2 trials from bed; pt with limited B knee flexion in sitting (pt's daughter reports this is baseline); vc's and tactile cues for hand and feet placement  Ambulation/Gait Ambulation/Gait assistance: Mod assist;+2 physical assistance Ambulation Distance (Feet): 3 Feet Assistive device: Rolling walker (2 wheeled) Gait Pattern/deviations: Decreased step length - right;Decreased step length - left;Shuffle Gait velocity: decreased   General Gait Details: walking bed to chair; vc's for taking steps; assist for walker navigation   Stairs            Wheelchair Mobility    Modified Rankin (Stroke Patients Only)       Balance Overall balance assessment: Needs assistance Sitting-balance support: Bilateral upper extremity supported;Feet supported Sitting balance-Leahy Scale: Fair Sitting balance - Comments: static sitting   Standing balance support: Bilateral upper extremity supported (on RW) Standing balance-Leahy Scale: Poor Standing balance comment: posterior lean in standing requiring assist to shift weight forward                            Cognition Overall Cognitive Status: Difficult to assess                                      Exercises     General Comments General comments (skin integrity, edema, etc.): Pt's  daughter present during session encouraging pt.      Pertinent Vitals/Pain Pain Assessment: Faces Faces Pain Scale: Hurts little more Pain Location: pt stated her throat hurt yesterday but not today Pain Descriptors / Indicators: Grimacing Pain Intervention(s): Limited activity within patient's tolerance;Monitored during session    Home Living               Prior Function    PT Goals (current goals can now be found in the care plan section) Acute  Rehab PT Goals Patient Stated Goal: to be able to ambulate again Progress towards PT goals: Progressing toward goals    Frequency    Min 2X/week      PT Plan Discharge plan needs to be updated    Co-evaluation             End of Session Equipment Utilized During Treatment: Gait belt Activity Tolerance: Patient limited by fatigue Patient left: in chair;with call bell/phone within reach;with chair alarm set;with family/visitor present Nurse Communication: Mobility status;Precautions PT Visit Diagnosis: Muscle weakness (generalized) (M62.81);Difficulty in walking, not elsewhere classified (R26.2)     Time: 0761-5183 PT Time Calculation (min) (ACUTE ONLY): 30 min  Charges:  $Therapeutic Activity: 23-37 mins                    G CodesLeitha Bleak, PT 02/03/17, 4:26 PM [phone]

## 2017-02-03 NOTE — Discharge Summary (Addendum)
[facility] NAME: Barbara Chavez    MR#:  [id number]  DATE OF BIRTH:  03-02-1927  DATE OF ADMISSION:  01/31/2017 ADMITTING PHYSICIAN: Demetrios Loll, MD  DATE OF DISCHARGE: 02/03/2017  PRIMARY CARE PHYSICIAN: Kirk Ruths., MD    ADMISSION DIAGNOSIS:  Hypokalemia [E87.6] AKI (acute kidney injury) ([place]) [N17.9] Altered mental status, unspecified altered mental status type [R41.82]  DISCHARGE DIAGNOSIS:  Active Problems:   Renal failure (ARF), acute on chronic (HCC)   Dementia due to medical condition without behavioral disturbance   Palliative care encounter   Goals of care, counseling/discussion   SECONDARY DIAGNOSIS:   Past Medical History:  Diagnosis Date  . Cancer ([place])   . Dementia   . Hypercholesterolemia   . Hypertension   . Lymphoma ([place])   . Osteoarthritis   . Reflux     HOSPITAL COURSE:   83 year old female with past medical history advanced dementia, GERD, osteoarthritis hypertension, hyperlipidemia who presents to the hospital due to altered mental status and noted to be in acute on chronic renal failure.  1. Acute on chronic renal failure-secondary to dehydration and poor by mouth intake due to advanced dementia. - pt. Was hydrated with IV fluids and BUN/Cr much improved and back to baseline.  - pt. Was on Metolazone, Torsemide which were held and are being discontinued for now.   - Baseline Cr around 2 and Cr. On day of discharge is 1.6.    2. Severe hypokalemia- due to poor PO intake dehydration  - improved and resolved w/ supplementation.   3. Hypernatremia - pt. Was given d5W and her sodium has now normalized.   4. Depression- She will continue Lexapro.  5. Hyperlipidemia- she will continue atorvastatin.  6. GERD- she will continue Protonix.  7. LLE swelling/pain - doppler of LE were (-) for DVt.  - likely chronic venous stasis due to poor mobility.   8.  Essential HTN - pt. Will  resume her Clonidine, Labetalol upon discharge.   Seen by palliative Care to discuss goals of care and as per their recommendations after discussion with Daughter Pt. Is to follow with Palliative Care at [facility].   DISCHARGE CONDITIONS:   Stable.   CONSULTS OBTAINED:  Treatment Team:  Lavonia Dana, MD Lloyd Huger, MD  DRUG ALLERGIES:  No Known Allergies  DISCHARGE MEDICATIONS:   Allergies as of 02/03/2017   No Known Allergies     Medication List    STOP taking these medications   metolazone 5 MG tablet Commonly known as:  ZAROXOLYN   torsemide 10 MG tablet Commonly known as:  DEMADEX     TAKE these medications   aspirin EC 81 MG tablet Take 81 mg by mouth daily.   atorvastatin 40 MG tablet Commonly known as:  LIPITOR Take 40 mg by mouth at bedtime.   busPIRone 15 MG tablet Commonly known as:  BUSPAR Take 15 mg by mouth 3 (three) times daily.   CALCIUM-CARB 600 PO Take 600 mg by mouth daily.   cloNIDine 0.2 MG tablet Commonly known as:  CATAPRES Take 0.2 mg by mouth 2 (two) times daily.   escitalopram 20 MG tablet Commonly known as:  LEXAPRO Take 20 mg by mouth daily.   fluticasone 50 MCG/ACT nasal spray Commonly known as:  FLONASE Place 2 sprays into both nostrils daily.   labetalol 300 MG tablet Commonly known as:  NORMODYNE Take 600 mg by mouth 2 (two) times daily.  pantoprazole 40 MG tablet Commonly known as:  PROTONIX Take 40 mg by mouth daily.   polyethylene glycol packet Commonly known as:  MIRALAX / GLYCOLAX Take 17 g by mouth daily.   QUEtiapine 25 MG tablet Commonly known as:  SEROQUEL Take 25 mg by mouth at bedtime.   traMADol 50 MG tablet Commonly known as:  ULTRAM Take 50 mg by mouth every 6 (six) hours as needed for moderate pain.   traZODone 100 MG tablet Commonly known as:  DESYREL Take 100 mg by mouth.   Vitamin D3 2000 units capsule Take 2,000 Units by mouth daily.         DISCHARGE  INSTRUCTIONS:   DIET:  Regular diet   Dysphagia II with thin liquids.  Strict Aspiration Precautions.   DISCHARGE CONDITION:  Stable  ACTIVITY:  Activity as tolerated  OXYGEN:  Home Oxygen: No.   Oxygen Delivery: room air  DISCHARGE LOCATION:  Family Care Home.    If you experience worsening of your admission symptoms, develop shortness of breath, life threatening emergency, suicidal or homicidal thoughts you must seek medical attention immediately by calling 911 or calling your MD immediately  if symptoms less severe.  You Must read complete instructions/literature along with all the possible adverse reactions/side effects for all the Medicines you take and that have been prescribed to you. Take any new Medicines after you have completely understood and accpet all the possible adverse reactions/side effects.   Please note  You were cared for by a hospitalist during your hospital stay. If you have any questions about your discharge medications or the care you received while you were in the hospital after you are discharged, you can call the unit and asked to speak with the hospitalist on call if the hospitalist that took care of you is not available. Once you are discharged, your primary care physician will handle any further medical issues. Please note that NO REFILLS for any discharge medications will be authorized once you are discharged, as it is imperative that you return to your primary care physician (or establish a relationship with a primary care physician if you do not have one) for your aftercare needs so that they can reassess your need for medications and monitor your lab values.     Today   No acute events overnight. Cr. Back to baseline.    VITAL SIGNS:  Blood pressure (!) 154/68, pulse 70, temperature 98.1 F (36.7 C), temperature source Oral, resp. rate 18, height 5\' 7"  (1.702 m), weight 86.6 kg (190 lb 15.4 oz), SpO2 99 %.  I/O:   Intake/Output Summary  (Last 24 hours) at 02/03/17 0922 Last data filed at 02/03/17 0836  Gross per 24 hour  Intake          1701.25 ml  Output                0 ml  Net          1701.25 ml    PHYSICAL EXAMINATION:   GENERAL:  83 y.o.-year-old patient lying in bed in no acute distress.  EYES: Pupils equal, round, reactive to light and accommodation. No scleral icterus. Extraocular muscles intact.  HEENT: Head atraumatic, normocephalic. Oropharynx and nasopharynx clear.  NECK:  Supple, no jugular venous distention. No thyroid enlargement, no tenderness.  LUNGS: Normal breath sounds bilaterally, no wheezing, rales, rhonchi. No use of accessory muscles of respiration.  CARDIOVASCULAR: S1, S2 normal. No murmurs, rubs, or gallops.  ABDOMEN: Soft, nontender, nondistended.  Bowel sounds present. No organomegaly or mass.  EXTREMITIES: No cyanosis, clubbing, + 1 LLE edema > right. Some pain on palpation of LLE.   NEUROLOGIC: Cranial nerves II through XII are intact. No focal Motor or sensory deficits b/l.  Globally weak.  PSYCHIATRIC: The patient is alert and oriented x 1.  SKIN: No obvious rash, lesion, or ulcer.    DATA REVIEW:   CBC  Recent Labs Lab 02/01/17 0501  WBC 6.0  HGB 9.0*  HCT 27.0*  PLT 223    Chemistries   Recent Labs Lab 01/31/17 1036 02/01/17 0501  02/03/17 0559  NA 140 144  < > 145  K 2.2* 2.9*  < > 3.8  CL 90* 100*  < > 105  CO2 38* 35*  < > 34*  GLUCOSE 130* 87  < > 129*  BUN 113* 110*  < > 53*  CREATININE 4.18* 3.28*  < > 1.60*  CALCIUM 9.8 9.2  < > 9.1  MG 1.5* 1.8  --   --   AST 32  --   --   --   ALT 20  --   --   --   ALKPHOS 64  --   --   --   BILITOT 0.9  --   --   --   < > = values in this interval not displayed.  Cardiac Enzymes No results for input(s): TROPONINI in the last 168 hours.   RADIOLOGY:  US Venous Img Lower Unilateral Left  Result Date: 02/02/2017 CLINICAL DATA:  Left lower extremity swelling. EXAM: LEFT LOWER EXTREMITY VENOUS DOPPLER  ULTRASOUND TECHNIQUE: Gray-scale sonography with graded compression, as well as color Doppler and duplex ultrasound were performed to evaluate the lower extremity deep venous systems from the level of the common femoral vein and including the common femoral, femoral, profunda femoral, popliteal and calf veins including the posterior tibial, peroneal and gastrocnemius veins when visible. The superficial great saphenous vein was also interrogated. Spectral Doppler was utilized to evaluate flow at rest and with distal augmentation maneuvers in the common femoral, femoral and popliteal veins. COMPARISON:  None. FINDINGS: Contralateral Common Femoral Vein: Respiratory phasicity is normal and symmetric with the symptomatic side. No evidence of thrombus. Normal compressibility. Common Femoral Vein: No evidence of thrombus. Normal compressibility, respiratory phasicity and response to augmentation. Saphenofemoral Junction: No evidence of thrombus. Normal compressibility and flow on color Doppler imaging. Profunda Femoral Vein: No evidence of thrombus. Normal compressibility and flow on color Doppler imaging. Femoral Vein: No evidence of thrombus. Normal compressibility, respiratory phasicity and response to augmentation. Popliteal Vein: No evidence of thrombus. Normal compressibility, respiratory phasicity and response to augmentation. Calf Veins: No evidence of thrombus. Normal compressibility and flow on color Doppler imaging. Superficial Great Saphenous Vein: No evidence of thrombus. Normal compressibility and flow on color Doppler imaging. Venous Reflux:  None. Other Findings:  None. IMPRESSION: No evidence of deep venous thrombosis. Electronically Signed   By: Earle Gell M.D.   On: 02/02/2017 18:28      Management plans discussed with the patient, family and they are in agreement.  CODE STATUS:     Code Status Orders        Start     Ordered   01/31/17 1850  Do not attempt resuscitation (DNR)   Continuous    Question Answer Comment  In the event of cardiac or respiratory ARREST Do not call a "code blue"   In the event of cardiac or respiratory  ARREST Do not perform Intubation, CPR, defibrillation or ACLS   In the event of cardiac or respiratory ARREST Use medication by any route, position, wound care, and other measures to relive pain and suffering. May use oxygen, suction and manual treatment of airway obstruction as needed for comfort.      01/31/17 1850    Advance Directive Documentation     Most Recent Value  Type of Advance Directive  Out of facility DNR (pink MOST or yellow form)  Pre-existing out of facility DNR order (yellow form or pink MOST form)  Yellow form placed in chart (order not valid for inpatient use)  "MOST" Form in Place?  -      TOTAL TIME TAKING CARE OF THIS PATIENT: 40 minutes.    [name] M.D on 02/03/2017 at 9:22 AM  Between 7am to 6pm - Pager - [phone]  After 6pm go to [email or web address] - Proofreader  [facility]  Office  [phone]  CC: Primary care physician; Kirk Ruths., MD

## 2017-02-03 NOTE — Progress Notes (Signed)
Patient picked up by EMS, Patient is alert to self, daughter at the bedside. No acute distress noted.

## 2017-02-03 NOTE — Progress Notes (Addendum)
Patient prepared for discharge, report called to [facility] spoke to  Early Osmond LPN and report given. Patient daughter at the bedside and updated by the doctor and the case manager. Patient IV discontinued site clean and dry. No acute distress noted. Patient to receive rehab at [facility]. EMS called to pick up patient

## 2017-02-03 NOTE — NC FL2 (Signed)
[place] LEVEL OF CARE SCREENING TOOL     IDENTIFICATION  Patient Name: Barbara Chavez Birthdate: 1927-09-13 Sex: female Admission Date (Current Location): 01/31/2017  [place] and [place] Number:  Engineering geologist and Address:  [facility], [address] [place], [place] [address]      Provider Number: [id number]  Attending Physician Name and Address:  [name], MD  Relative Name and Phone Number:       Current Level of Care: Hospital Recommended Level of Care: [place] Prior Approval Number:    Date Approved/Denied:   PASRR Number:    Discharge Plan: SNF    Current Diagnoses: Patient Active Problem List   Diagnosis Date Noted  . Dementia due to medical condition without behavioral disturbance   . Palliative care encounter   . Goals of care, counseling/discussion   . Renal failure (ARF), acute on chronic ([place]) 01/31/2017  . Hyponatremia 06/27/2015  . Follicular non-Hodgkin's lymphoma ([place]) 05/24/2015  . Adenopathy 05/03/2015  . Anemia 04/20/2015  . 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 Humerus lesion, right 04/20/2015    Orientation RESPIRATION BLADDER Height & Weight     Self  Normal Incontinent Weight: 190 lb 15.4 oz (86.6 kg) (bed scale, 2 blankets) Height:  5\' 7"  (170.2 cm)  BEHAVIORAL SYMPTOMS/MOOD NEUROLOGICAL BOWEL NUTRITION STATUS   (will pinch if she is in pain)  (none) Continent Diet (dysphagia 2)  AMBULATORY STATUS COMMUNICATION OF NEEDS Skin   Extensive Assist Verbally Normal                       Personal Care Assistance Level of Assistance  Bathing, Dressing, Feeding Bathing Assistance: Limited assistance Feeding assistance: Limited assistance Dressing Assistance: Limited assistance Total Care Assistance: Limited assistance   Functional Limitations Info  Hearing Sight Info: Adequate (wears glasses) Hearing Info: Impaired Speech Info: Adequate    SPECIAL  CARE FACTORS FREQUENCY  PT (By licensed PT)                    Contractures Contractures Info: Not present    Additional Factors Info  Code Status, Allergies Code Status Info: dnr Allergies Info: nka           Current Medications (02/03/2017):  This is the current hospital active medication list Current Facility-Administered Medications  Medication Dose Route Frequency Provider Last Rate Last Dose  . acetaminophen (TYLENOL) tablet 650 mg  650 mg Oral Q6H PRN Demetrios Loll, MD       Or  . acetaminophen (TYLENOL) suppository 650 mg  650 mg Rectal Q6H PRN Demetrios Loll, MD      . albuterol (PROVENTIL) (2.5 MG/3ML) 0.083% nebulizer solution 2.5 mg  2.5 mg Nebulization Q2H PRN Demetrios Loll, MD      . aspirin EC tablet 81 mg  81 mg Oral Daily Demetrios Loll, MD   81 mg at 02/02/17 1028  . atorvastatin (LIPITOR) tablet 40 mg  40 mg Oral QHS Demetrios Loll, MD   40 mg at 02/02/17 2117  . bisacodyl (DULCOLAX) EC tablet 5 mg  5 mg Oral Daily PRN Demetrios Loll, MD      . busPIRone (BUSPAR) tablet 15 mg  15 mg Oral TID Demetrios Loll, MD   15 mg at 02/02/17 2117  . dextrose 5 % with KCl 20 mEq / L  infusion  20 mEq Intravenous Continuous [name], MD 75 mL/hr at 02/02/17 2117  20 mEq at 02/02/17 2117  . escitalopram (LEXAPRO) tablet 20 mg  20 mg Oral Daily Demetrios Loll, MD   20 mg at 02/02/17 1027  . feeding supplement (BOOST / RESOURCE BREEZE) liquid 1 Container  1 Container Oral TID BM [name], MD   1 Container at 02/02/17 2118  . fluticasone (FLONASE) 50 MCG/ACT nasal spray 2 spray  2 spray Each Nare Daily Demetrios Loll, MD   2 spray at 02/02/17 1042  . heparin injection 5,000 Units  5,000 Units Subcutaneous Q8H Demetrios Loll, MD   5,000 Units at 02/03/17 0543  . HYDROcodone-acetaminophen (NORCO/VICODIN) 5-325 MG per tablet 1-2 tablet  1-2 tablet Oral Q4H PRN Demetrios Loll, MD   1 tablet at 01/31/17 2152  . ondansetron (ZOFRAN) tablet 4 mg  4 mg Oral Q6H PRN Demetrios Loll, MD       Or  . ondansetron [facility]) injection 4  mg  4 mg Intravenous Q6H PRN Demetrios Loll, MD      . pantoprazole (PROTONIX) EC tablet 40 mg  40 mg Oral Daily Demetrios Loll, MD   40 mg at 02/02/17 1027  . polyethylene glycol (MIRALAX / GLYCOLAX) packet 17 g  17 g Oral QPM Demetrios Loll, MD   17 g at 02/02/17 2118  . QUEtiapine (SEROQUEL) tablet 25 mg  25 mg Oral QHS Demetrios Loll, MD   25 mg at 02/02/17 2117  . senna-docusate (Senokot-S) tablet 1 tablet  1 tablet Oral QHS PRN Demetrios Loll, MD      . traZODone (DESYREL) tablet 100 mg  100 mg Oral QHS Demetrios Loll, MD   100 mg at 02/02/17 2117     Discharge Medications: Please see discharge summary for a list of discharge medications.  Relevant Imaging Results:  Relevant Lab Results:   Additional Information ss: [id number]  Shela Leff, LCSW

## 2017-02-03 NOTE — Evaluation (Signed)
Occupational Therapy Evaluation Patient Details Name: Barbara Chavez MRN: [id number] DOB: 1927/05/12 Today's Date: 02/03/2017    History of Present Illness Pt is an 83 y.o. female presenting to hospital with episode of unresponsiveness after breakfast.  Pt with h/o diarrhea about 1 week ago and with AMS and decreased oral intake x1 week.  Pt admitted to hospital with acute metabolic encephalopathy, acute on chronic kidney failure, severe hypokalemia, hypomagnesemia, and hypotension.  PMH includes dementia, htn, and lymphoma in remission.   Clinical Impression   Pt seen for OT evaluation this date. Daughter present throughout session. Pt's participation very limited due to lethargy/fatigue following PT session requiring extensive assist for mobility. Pt presents with decreased cognition ( worse than hx of cognitive impairment at baseline), decreased safety awareness, decreased activity tolerance, and need for additional assistance for functional mobility and self care tasks.  Caregiver education and support provided to daughter who self reports as primary caregiver for pt until recently when pt transitioned to group home (February 2018). Daughter very thankful for support, stating, "Thank you for what you do; I'm much calmer after talking with you." Pt would benefit from skilled OT services in STR setting to address noted impairments and functional deficits in order to maximize return to PLOF before return to group home. Daughter encouraged to participate in STR to the extent possible to maximize participation by pt.     Follow Up Recommendations  SNF    Equipment Recommendations  Other (comment);None recommended by OT (defer to next venue of care)    Recommendations for Other Services       Precautions / Restrictions Precautions Precautions: Fall Restrictions Weight Bearing Restrictions: No      Mobility Bed Mobility               General bed mobility comments: did not  assess, pt up in recliner at start of OT session  Transfers                 General transfer comment: deferred d/t level of participation/lethargy    Balance                                           ADL either performed or assessed with clinical judgement   ADL Overall ADL's : Needs assistance/impaired   Eating/Feeding Details (indicate cue type and reason): dtr reports pt asked for water this morning and was able to hold a cup to bring to her mouth without physical assist after set up; Pt too lethargic to participate in self feeding/drinking with OT                                   General ADL Comments: Difficult to assess due to pt's lethargy/fatigue/participation; hand mitts applied due to agitation and pinching PT asst during recent PT session     Vision Patient Visual Report: Other (comment) (dementia likely to impair peripheral vision)       Perception     Praxis      Pertinent Vitals/Pain Pain Assessment: Faces Faces Pain Scale: Hurts little more Pain Location: pt stated her throat hurt yesterday but not today Pain Descriptors / Indicators: Grimacing Pain Intervention(s): Limited activity within patient's tolerance;Monitored during session     Hand Dominance     Extremity/Trunk Assessment Upper Extremity  Assessment Upper Extremity Assessment: Difficult to assess due to impaired cognition   Lower Extremity Assessment Lower Extremity Assessment: Difficult to assess due to impaired cognition       Communication Communication Communication: HOH;Other (comment) (dementia diagnosis noted which may impair receptive abilities; previous PT note indicates HOH with L better than R)   Cognition Arousal/Alertness: Lethargic (opened eyes with encouragement, very sleepy) Behavior During Therapy:  (PT reported pt became agitated during session directly before OT evaluation, but pt was very lethargic/fatigued for OT) Overall  Cognitive Status: Difficult to assess                                 General Comments: history of cognitive impairment, based on dtr's report, worse than baseline at the moment (pt reported to be "very easy going" at baseline)   General Comments       Exercises Other Exercises Other Exercises: caregiver education and supports provided to daughter; dtr/pt have been utilizing Hospice and [facility] for supports   Shoulder Instructions      Home Living Family/patient expects to be discharged to:: Group home ([facility] (6 residents) - assist living level of care provided)                             Home Equipment: Walker - 2 wheels   Additional Comments: Daughter reports pt using RW at group home for ambulation      Prior Functioning/Environment Level of Independence: Needs assistance  Gait / Transfers Assistance Needed: Dtr reports RW for ambulation  ADL's / Homemaking Assistance Needed: Dtr reports pt required mod-max A for bathing, dressing but pt was able to use the bathroom independently (likely still supervision level), was able to self feed without difficulties; pt has a "passion for flowers"   Comments: Pt recently moved to group home (February) and previously lived at home alone with hired aide during the day and dtr staying overnight to provide assist for pt        OT Problem List: Decreased cognition;Decreased safety awareness;Decreased activity tolerance      OT Treatment/Interventions: Self-care/ADL training;Energy conservation;Patient/family education    OT Goals(Current goals can be found in the care plan section) Acute Rehab OT Goals OT Goal Formulation: Patient unable to participate in goal setting  OT Frequency: Min 1X/week   Barriers to D/C:            Co-evaluation              End of Session    Activity Tolerance: Patient limited by fatigue;Patient limited by  lethargy Patient left: in chair;with call bell/phone within reach;with chair alarm set;with family/visitor present  OT Visit Diagnosis: Other abnormalities of gait and mobility (R26.89);Other symptoms and signs involving cognitive function                Time: 9390-3009 OT Time Calculation (min): 24 min Charges:  OT General Charges $OT Visit: 1 Procedure OT Evaluation $OT Eval Moderate Complexity: 1 Procedure OT Treatments $Self Care/Home Management : 8-22 mins G-Codes:     Jeni Salles, MPH, MS, OTR/L ascom [phone] 02/03/17, 2:10 PM

## 2017-02-04 LAB — SOLUBLE TRANSFERRIN RECEPTOR: TRANSFERRIN RECEPTOR: 26.3 nmol/L (ref 12.2–27.3)

## 2017-02-05 ENCOUNTER — Inpatient Hospital Stay: Payer: Medicare Other

## 2017-02-05 ENCOUNTER — Inpatient Hospital Stay: Payer: Medicare Other | Admitting: Oncology

## 2017-02-10 NOTE — [place] Notes (Signed)
[place]  Telephone:([phone] Fax:([phone]) [phone]  ID: Barbara Chavez OB: 07/05/1927  MR#: [id number]  GGE#:[id number]  Patient Care Team: Kirk Ruths, MD as PCP - General (Internal Medicine)  CHIEF COMPLAINT: Follicular lymphoma, iron deficiency anemia.  INTERVAL HISTORY: Patient returns to clinic for routine 6 month follow up. Her dementia is getting worse and the entire history is given by her caretaker. Her daughter was unable to attend the appointment since she herself is in the hospital. She continues to have persistent weakness and fatigue. She continues to have bilateral shoulder pain. She denies any fevers. She has no neurologic complaints. She denies any chest pain or shortness of breath. She has a poor appetite. She denies any nausea, vomiting, constipation, or diarrhea. She has no urinary complaints. Patient offers no further specific complaints today.  REVIEW OF SYSTEMS:   Review of Systems  Constitutional: Positive for malaise/fatigue. Negative for fever and weight loss.  Respiratory: Negative.  Negative for cough and shortness of breath.   Cardiovascular: Negative.  Negative for chest pain and leg swelling.  Gastrointestinal: Negative for abdominal pain, nausea and vomiting.  Genitourinary: Negative.   Musculoskeletal: Positive for joint pain.  Neurological: Positive for weakness. Negative for sensory change.  Psychiatric/Behavioral: Positive for memory loss.    As per HPI. Otherwise, a complete review of systems is negative.  PAST MEDICAL HISTORY: Past Medical History:  Diagnosis Date  . Cancer ([place])   . Dementia   . Hypercholesterolemia   . Hypertension   . Lymphoma ([place])   . Osteoarthritis   . Reflux     PAST SURGICAL HISTORY: Past Surgical History:  Procedure Laterality Date  . CATARACT EXTRACTION    . KNEE ARTHROSCOPY      FAMILY HISTORY Family History  Problem Relation Age of Onset  . Cancer Sister   .  Breast cancer Sister        ADVANCED DIRECTIVES:    HEALTH MAINTENANCE: Social History  Substance Use Topics  . Smoking status: Never Smoker  . Smokeless tobacco: Never Used  . Alcohol use No     Colonoscopy:  PAP:  Bone density:  Lipid panel:  No Known Allergies  Current Outpatient Prescriptions  Medication Sig Dispense Refill  . aspirin EC 81 MG tablet Take 81 mg by mouth daily.    Marland Kitchen atorvastatin (LIPITOR) 40 MG tablet Take 40 mg by mouth at bedtime.    . busPIRone (BUSPAR) 15 MG tablet Take 15 mg by mouth 3 (three) times daily.     . Calcium Carbonate (CALCIUM-CARB 600 PO) Take 600 mg by mouth daily.    . Cholecalciferol (VITAMIN D3) 2000 units capsule Take 2,000 Units by mouth daily.    . cloNIDine (CATAPRES) 0.2 MG tablet Take 0.2 mg by mouth 2 (two) times daily.    . divalproex (DEPAKOTE SPRINKLE) 125 MG capsule Take 250 mg by mouth 2 (two) times daily.    Marland Kitchen escitalopram (LEXAPRO) 20 MG tablet Take 20 mg by mouth daily.    . fluticasone (FLONASE) 50 MCG/ACT nasal spray Place 2 sprays into both nostrils daily.     Marland Kitchen labetalol (NORMODYNE) 300 MG tablet Take 600 mg by mouth 2 (two) times daily.    . pantoprazole (PROTONIX) 40 MG tablet Take 40 mg by mouth daily.     . QUEtiapine (SEROQUEL) 25 MG tablet Take 25 mg by mouth at bedtime.    . traMADol (ULTRAM) 50 MG tablet Take 1 tablet (50 mg total)  by mouth every 6 (six) hours as needed for moderate pain. 30 tablet 0   No current facility-administered medications for this visit.     OBJECTIVE: Vitals:   02/11/17 1111  BP: (!) 147/75  Pulse: 99  Resp: 18  Temp: 97.4 F (36.3 C)     There is no height or weight on file to calculate BMI.    ECOG FS:3 - Symptomatic, >50% confined to bed  General: Well-developed, well-nourished, no acute distress. Sitting in a wheel chair. Eyes: Pink conjunctiva, anicteric sclera. Lungs: Clear to auscultation bilaterally. Heart: Regular rate and rhythm. No rubs, murmurs, or  gallops. Abdomen: Soft, nontender, nondistended. No organomegaly noted, normoactive bowel sounds. Musculoskeletal: No edema, cyanosis, or clubbing. Neuro: Confused. Skin: No rashes or petechiae noted. Psych: Normal affect. Lymphatics: No cervical, calvicular, axillary or inguinal LAD.   LAB RESULTS:  Lab Results  Component Value Date   NA 145 02/03/2017   K 3.8 02/03/2017   CL 105 02/03/2017   CO2 34 (H) 02/03/2017   GLUCOSE 129 (H) 02/03/2017   BUN 53 (H) 02/03/2017   CREATININE 1.60 (H) 02/03/2017   CALCIUM 9.1 02/03/2017   PROT 7.3 01/31/2017   ALBUMIN 3.5 01/31/2017   AST 32 01/31/2017   ALT 20 01/31/2017   ALKPHOS 64 01/31/2017   BILITOT 0.9 01/31/2017   GFRNONAA 28 (L) 02/03/2017   GFRAA 32 (L) 02/03/2017    Lab Results  Component Value Date   WBC 4.7 02/11/2017   NEUTROABS 3.1 02/11/2017   HGB 9.6 (L) 02/11/2017   HCT 28.8 (L) 02/11/2017   MCV 86.3 02/11/2017   PLT 273 02/11/2017   Lab Results  Component Value Date   IRON 31 02/11/2017   TIBC 274 02/11/2017   IRONPCTSAT 11 02/11/2017   Lab Results  Component Value Date   FERRITIN 49 02/11/2017     STUDIES: US Venous Img Lower Unilateral Left  Result Date: 02/02/2017 CLINICAL DATA:  Left lower extremity swelling. EXAM: LEFT LOWER EXTREMITY VENOUS DOPPLER ULTRASOUND TECHNIQUE: Gray-scale sonography with graded compression, as well as color Doppler and duplex ultrasound were performed to evaluate the lower extremity deep venous systems from the level of the common femoral vein and including the common femoral, femoral, profunda femoral, popliteal and calf veins including the posterior tibial, peroneal and gastrocnemius veins when visible. The superficial great saphenous vein was also interrogated. Spectral Doppler was utilized to evaluate flow at rest and with distal augmentation maneuvers in the common femoral, femoral and popliteal veins. COMPARISON:  None. FINDINGS: Contralateral Common Femoral Vein:  Respiratory phasicity is normal and symmetric with the symptomatic side. No evidence of thrombus. Normal compressibility. Common Femoral Vein: No evidence of thrombus. Normal compressibility, respiratory phasicity and response to augmentation. Saphenofemoral Junction: No evidence of thrombus. Normal compressibility and flow on color Doppler imaging. Profunda Femoral Vein: No evidence of thrombus. Normal compressibility and flow on color Doppler imaging. Femoral Vein: No evidence of thrombus. Normal compressibility, respiratory phasicity and response to augmentation. Popliteal Vein: No evidence of thrombus. Normal compressibility, respiratory phasicity and response to augmentation. Calf Veins: No evidence of thrombus. Normal compressibility and flow on color Doppler imaging. Superficial Great Saphenous Vein: No evidence of thrombus. Normal compressibility and flow on color Doppler imaging. Venous Reflux:  None. Other Findings:  None. IMPRESSION: No evidence of deep venous thrombosis. Electronically Signed   By: Earle Gell M.D.   On: 02/02/2017 18:28   Dg Chest Portable 1 View  Result Date: 01/31/2017 CLINICAL DATA:  Altered mental status EXAM: PORTABLE CHEST 1 VIEW COMPARISON:  07/23/2016 chest radiograph. FINDINGS: Stable cardiomediastinal silhouette with top-normal heart size and aortic atherosclerosis. No pneumothorax. No pleural effusion. Faint hazy peripheral left mid lung opacity appears new. Otherwise clear lungs. IMPRESSION: 1. New faint hazy peripheral left mid lung opacity, cannot exclude a developing pneumonia. Recommend follow-up PA and lateral post treatment chest radiographs in 4-6 weeks. 2. Aortic atherosclerosis. Electronically Signed   By: Ilona Sorrel M.D.   On: 01/31/2017 12:01    ASSESSMENT: Grade 1, at least stage III follicular lymphoma, no bone marrow biopsy was performed secondary to advanced age. Iron deficiency anemia.  PLAN:    1. Follicular lymphoma: PET scan results from  September 05, 2015 revealed a complete metabolic response.  Patient completed weekly Rituxan 4 on June 20, 2015.  No further intervention is needed. CT chest from May 26, 2016 reviewed independently with no evidence of recurrence. Given patient's worsening dementia and declining performance status, no further imaging is recommended. No follow-up has been scheduled.    2. Iron deficiency anemia: Patient's hemoglobin is trending down, she is symptomatic, and her iron stores are borderline low. Proceed with one infusion of 510 mg Feraheme today. No further follow-up is necessary.  3. Renal insufficiency: Patient's creatinine is approximately her baseline, monitor. 4. Shoulder pain: Bone scan and PET results previously reviewed revealed significant abnormalities, but not concerning for progressive lymphoma. Continue symptomatic treatment per PCP.  Patient expressed understanding and was in agreement with this plan. She also understands that She can call clinic at any time with any questions, concerns, or complaints.   Lloyd Huger, MD   02/15/2017 9:44 AM

## 2017-02-11 ENCOUNTER — Inpatient Hospital Stay (HOSPITAL_BASED_OUTPATIENT_CLINIC_OR_DEPARTMENT_OTHER): Payer: No Typology Code available for payment source | Admitting: Oncology

## 2017-02-11 ENCOUNTER — Inpatient Hospital Stay: Payer: No Typology Code available for payment source | Attending: Oncology

## 2017-02-11 ENCOUNTER — Inpatient Hospital Stay: Payer: No Typology Code available for payment source

## 2017-02-11 ENCOUNTER — Other Ambulatory Visit: Payer: Self-pay

## 2017-02-11 VITALS — BP 146/74 | HR 89 | Temp 97.6°F | Resp 18

## 2017-02-11 VITALS — BP 147/75 | HR 99 | Temp 97.4°F | Resp 18

## 2017-02-11 DIAGNOSIS — M1990 Unspecified osteoarthritis, unspecified site: Secondary | ICD-10-CM

## 2017-02-11 DIAGNOSIS — I10 Essential (primary) hypertension: Secondary | ICD-10-CM | POA: Diagnosis not present

## 2017-02-11 DIAGNOSIS — Z79899 Other long term (current) drug therapy: Secondary | ICD-10-CM

## 2017-02-11 DIAGNOSIS — D508 Other iron deficiency anemias: Secondary | ICD-10-CM

## 2017-02-11 DIAGNOSIS — R918 Other nonspecific abnormal finding of lung field: Secondary | ICD-10-CM

## 2017-02-11 DIAGNOSIS — Z803 Family history of malignant neoplasm of breast: Secondary | ICD-10-CM | POA: Insufficient documentation

## 2017-02-11 DIAGNOSIS — F0390 Unspecified dementia without behavioral disturbance: Secondary | ICD-10-CM | POA: Insufficient documentation

## 2017-02-11 DIAGNOSIS — Z7982 Long term (current) use of aspirin: Secondary | ICD-10-CM

## 2017-02-11 DIAGNOSIS — R630 Anorexia: Secondary | ICD-10-CM | POA: Diagnosis not present

## 2017-02-11 DIAGNOSIS — K219 Gastro-esophageal reflux disease without esophagitis: Secondary | ICD-10-CM | POA: Insufficient documentation

## 2017-02-11 DIAGNOSIS — N289 Disorder of kidney and ureter, unspecified: Secondary | ICD-10-CM

## 2017-02-11 DIAGNOSIS — R5383 Other fatigue: Secondary | ICD-10-CM | POA: Diagnosis not present

## 2017-02-11 DIAGNOSIS — R531 Weakness: Secondary | ICD-10-CM | POA: Insufficient documentation

## 2017-02-11 DIAGNOSIS — M25511 Pain in right shoulder: Secondary | ICD-10-CM

## 2017-02-11 DIAGNOSIS — I700 Atherosclerosis of aorta: Secondary | ICD-10-CM | POA: Diagnosis not present

## 2017-02-11 DIAGNOSIS — E7800 Pure hypercholesterolemia, unspecified: Secondary | ICD-10-CM | POA: Insufficient documentation

## 2017-02-11 DIAGNOSIS — M25512 Pain in left shoulder: Secondary | ICD-10-CM | POA: Diagnosis not present

## 2017-02-11 DIAGNOSIS — D509 Iron deficiency anemia, unspecified: Secondary | ICD-10-CM | POA: Insufficient documentation

## 2017-02-11 DIAGNOSIS — C8280 Other types of follicular lymphoma, unspecified site: Secondary | ICD-10-CM

## 2017-02-11 DIAGNOSIS — C8290 Follicular lymphoma, unspecified, unspecified site: Secondary | ICD-10-CM

## 2017-02-11 LAB — IRON AND TIBC
IRON: 31 ug/dL (ref 28–170)
Saturation Ratios: 11 % (ref 10.4–31.8)
TIBC: 274 ug/dL (ref 250–450)
UIBC: 243 ug/dL

## 2017-02-11 LAB — CBC WITH DIFFERENTIAL/PLATELET
BASOS ABS: 0.1 10*3/uL (ref 0–0.1)
BASOS PCT: 1 %
EOS ABS: 0.1 10*3/uL (ref 0–0.7)
Eosinophils Relative: 3 %
HCT: 28.8 % — ABNORMAL LOW (ref 35.0–47.0)
HEMOGLOBIN: 9.6 g/dL — AB (ref 12.0–16.0)
Lymphocytes Relative: 15 %
Lymphs Abs: 0.7 10*3/uL — ABNORMAL LOW (ref 1.0–3.6)
MCH: 28.8 pg (ref 26.0–34.0)
MCHC: 33.4 g/dL (ref 32.0–36.0)
MCV: 86.3 fL (ref 80.0–100.0)
MONOS PCT: 15 %
Monocytes Absolute: 0.7 10*3/uL (ref 0.2–0.9)
NEUTROS PCT: 66 %
Neutro Abs: 3.1 10*3/uL (ref 1.4–6.5)
Platelets: 273 10*3/uL (ref 150–440)
RBC: 3.34 MIL/uL — ABNORMAL LOW (ref 3.80–5.20)
RDW: 15.7 % — AB (ref 11.5–14.5)
WBC: 4.7 10*3/uL (ref 3.6–11.0)

## 2017-02-11 LAB — FERRITIN: FERRITIN: 49 ng/mL (ref 11–307)

## 2017-02-11 MED ORDER — SODIUM CHLORIDE 0.9 % IV SOLN
Freq: Once | INTRAVENOUS | Status: AC
  Administered 2017-02-11: 12:00:00 via INTRAVENOUS
  Filled 2017-02-11: qty 1000

## 2017-02-11 MED ORDER — SODIUM CHLORIDE 0.9 % IV SOLN
510.0000 mg | Freq: Once | INTRAVENOUS | Status: AC
  Administered 2017-02-11: 510 mg via INTRAVENOUS
  Filled 2017-02-11: qty 17

## 2017-02-11 NOTE — Progress Notes (Signed)
Pt is pleasantly demented and does not appear to be in any distress.

## 2017-02-16 IMAGING — MR MR SHOULDER*R* W/O CM
5 series · 40 of 40 positions shown · non-contrast
Comparison: None.

CLINICAL DATA: Right shoulder pain

EXAM:
MRI OF THE RIGHT SHOULDER WITHOUT CONTRAST
TECHNIQUE: Multiplanar, multisequence MR imaging of the shoulder was performed.
We used blade protocol to speed up acquisition due to the severity
of patient motion. No intravenous contrast was administered.

[Series 4: T2 fat-sat · axial · 4.0mm · 0.47mm/px · z∈[-44,+48]mm · 8 of 22 slices shown (1 of 3)]
[im 1/22]
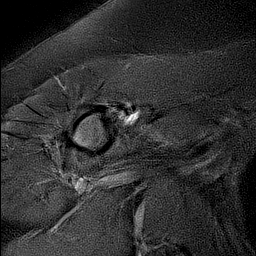
[im 4/22]
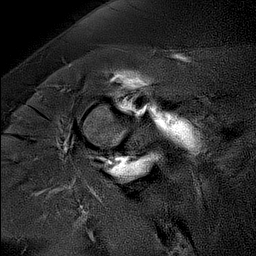
[im 7/22]
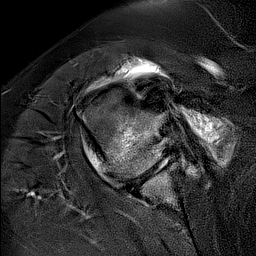
[im 10/22]
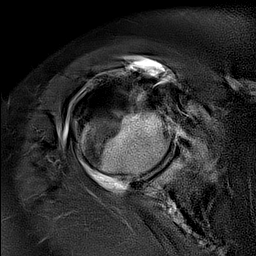
[im 13/22]
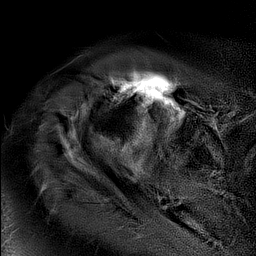
[im 16/22]
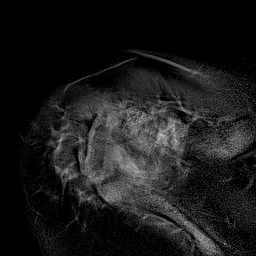
[im 19/22]
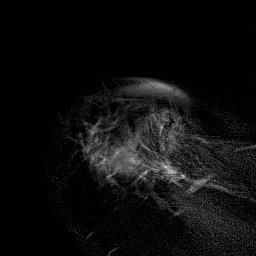
[im 22/22]
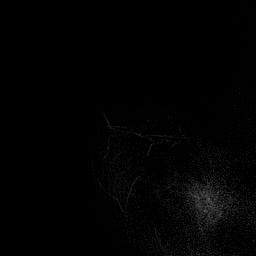

[Series 5: PD · oblique · 4.0mm · 0.62mm/px · 8 of 21 slices shown]
[im 1/21]
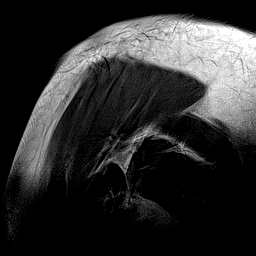
[im 3/21]
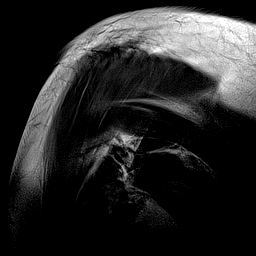
[im 6/21]
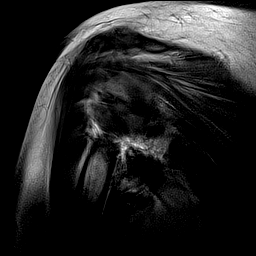
[im 9/21]
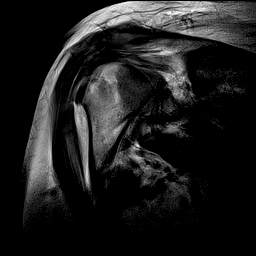
[im 12/21]
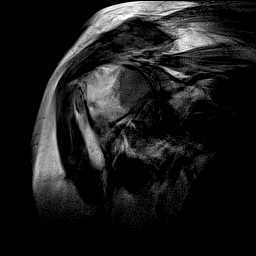
[im 15/21]
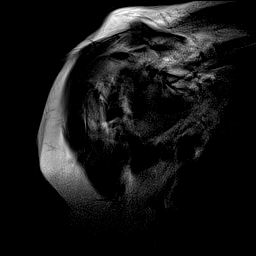
[im 18/21]
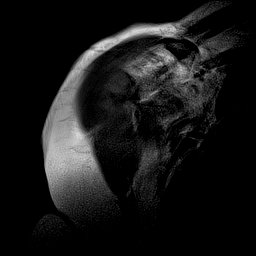
[im 21/21]
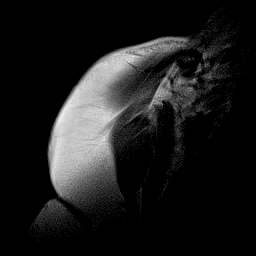

[Series 6: T2 fat-sat · oblique · 4.0mm · 0.62mm/px · 8 of 21 slices shown (2 of 3)]
[im 1/21]
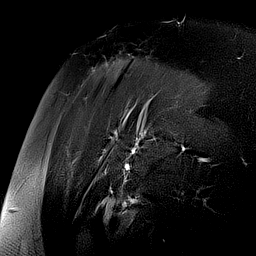
[im 3/21]
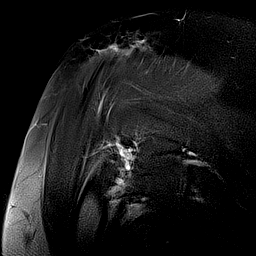
[im 6/21]
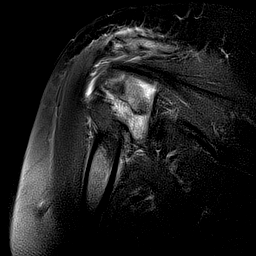
[im 9/21]
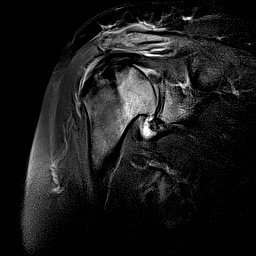
[im 12/21]
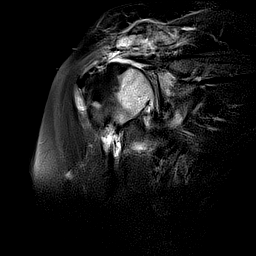
[im 15/21]
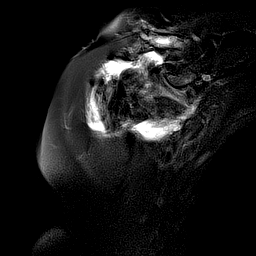
[im 18/21]
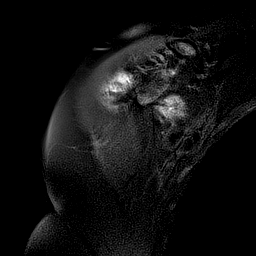
[im 21/21]
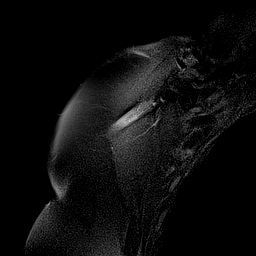

[Series 7: T1 · oblique · 4.0mm · 0.55mm/px · 8 of 20 slices shown]
[im 1/20]
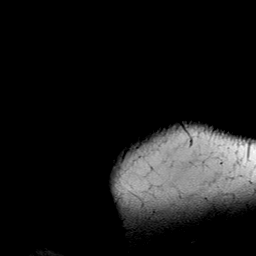
[im 3/20]
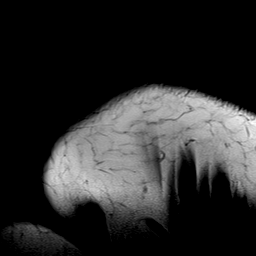
[im 6/20]
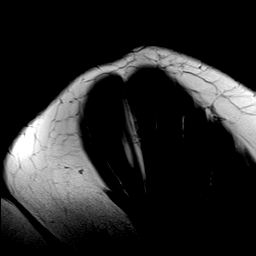
[im 9/20]
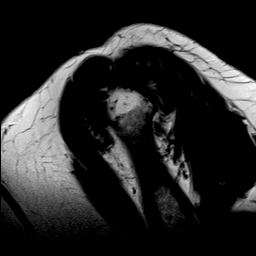
[im 11/20]
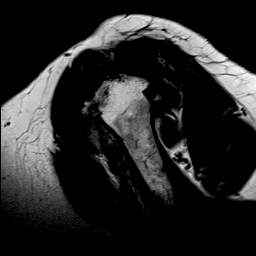
[im 14/20]
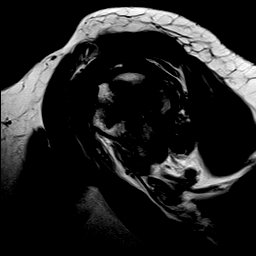
[im 17/20]
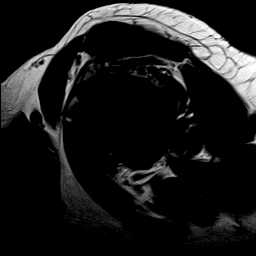
[im 20/20]
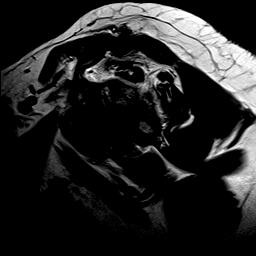

[Series 8: T2 fat-sat · oblique · 4.0mm · 0.55mm/px · 8 of 20 slices shown (3 of 3)]
[im 1/20]
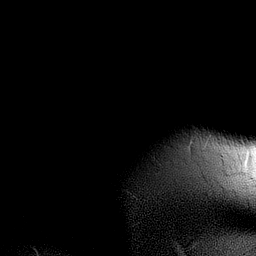
[im 3/20]
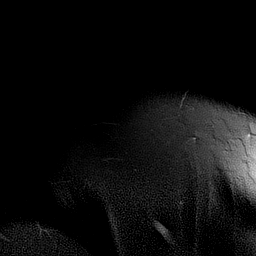
[im 6/20]
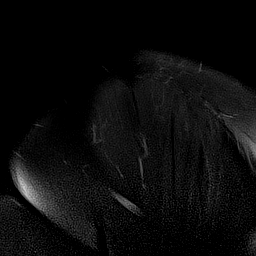
[im 9/20]
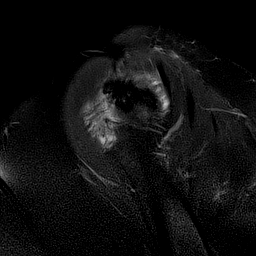
[im 11/20]
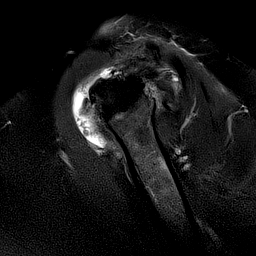
[im 14/20]
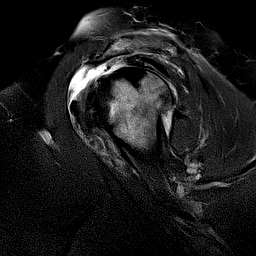
[im 17/20]
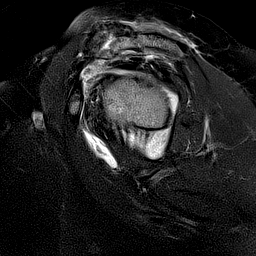
[im 20/20]
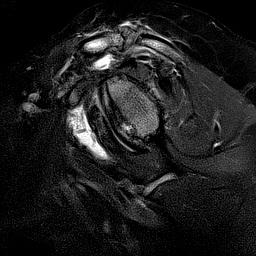

[40 of 40 positions shown; findings below may reference images not displayed]

FINDINGS: Despite efforts by the technologist and patient, severe motion
artifact is present on today's exam and could not be eliminated.
This reduces exam sensitivity and specificity.

Rotator cuff: Full-thickness partial width tear of the majority of
the supraspinatus tendon. Moderate to severe subscapularis
tendinopathy.

Muscles:  Mildly atrophic supraspinatus muscle.

Biceps long head:  Mild tendinopathy.

Acromioclavicular Joint: Severe degenerative arthropathy with
spurring and extensive edema. Expansile appearing acromial fracture
with cortical destruction and extensive marrow edema. Extensive
edema in the AC joint and distal clavicle. Considerable fluid in the
subacromial subdeltoid bursa with evidence of filling defects
favoring prominent synovitis. This extends into the subcoracoid
recess.

Glenohumeral Joint: Shoulder joint effusion. Severe degenerative
glenohumeral arthropathy.

Labrum:  Poorly defined labrum, indeterminate for tear.

Bones: Geographic abnormal high T2 and low T1 signal medially in the
head of the humerus. Patchy abnormal edema signal in the glenoid.
Extensive humeral head spurring.
IMPRESSION: 1. Expansile and destructive process of the acromion with possible
involvement of the distal clavicle and AC joint. Geographic abnormal
involvement of the humeral head medially. Joint effusion with
synovitis. Differential diagnostic considerations include myeloma or
metastatic malignancy, bony destructive findings from osteomyelitis
and septic joint, or much less likely gout arthropathy with
incidental fracture in the acromion. Correlate with history of
malignancy and consider further workup, possibly to include
whole-body bone scan, workup for myeloma, CBC with differential
white blood cell count, and potential CT of the chest, abdomen, and
pelvis.
2. Full-thickness partial width tear of most of the supraspinatus
tendon.
3. Moderate to severe subscapularis tendinopathy and mild biceps
tendinopathy.
4. Severe degenerative glenohumeral arthropathy.
5. Despite efforts by the technologist and patient, motion artifact
is present on today's exam and could not be eliminated. This reduces
exam sensitivity and specificity.
These results will be called to the ordering clinician or
representative by the Radiologist Assistant, and communication
documented in the PACS or zVision Dashboard.

## 2017-03-18 ENCOUNTER — Ambulatory Visit: Payer: Medicare Other

## 2017-03-18 ENCOUNTER — Ambulatory Visit: Payer: Medicare Other | Admitting: Oncology

## 2017-03-18 ENCOUNTER — Other Ambulatory Visit: Payer: Medicare Other

## 2017-04-10 DEATH — deceased

## 2017-06-24 ENCOUNTER — Other Ambulatory Visit: Payer: Self-pay | Admitting: Nurse Practitioner
